# Patient Record
Sex: Female | Born: 1960 | Race: Black or African American | Hispanic: No | Smoking: Former smoker
Health system: Southern US, Community
[De-identification: ages and names within clinical notes are randomized; demographics above are authoritative.]

## PROBLEM LIST (undated history)

## (undated) ENCOUNTER — Inpatient Hospital Stay (HOSPITAL_COMMUNITY)

## (undated) DIAGNOSIS — I10 Essential (primary) hypertension: Secondary | ICD-10-CM

## (undated) DIAGNOSIS — N950 Postmenopausal bleeding: Secondary | ICD-10-CM

## (undated) DIAGNOSIS — N183 Chronic kidney disease, stage 3 unspecified: Secondary | ICD-10-CM

## (undated) DIAGNOSIS — Z973 Presence of spectacles and contact lenses: Secondary | ICD-10-CM

## (undated) DIAGNOSIS — E119 Type 2 diabetes mellitus without complications: Secondary | ICD-10-CM

## (undated) DIAGNOSIS — F419 Anxiety disorder, unspecified: Secondary | ICD-10-CM

## (undated) DIAGNOSIS — E785 Hyperlipidemia, unspecified: Secondary | ICD-10-CM

## (undated) DIAGNOSIS — F32A Depression, unspecified: Secondary | ICD-10-CM

## (undated) DIAGNOSIS — F2089 Other schizophrenia: Secondary | ICD-10-CM

## (undated) DIAGNOSIS — K08109 Complete loss of teeth, unspecified cause, unspecified class: Secondary | ICD-10-CM

## (undated) DIAGNOSIS — N189 Chronic kidney disease, unspecified: Secondary | ICD-10-CM

## (undated) DIAGNOSIS — N8501 Benign endometrial hyperplasia: Secondary | ICD-10-CM

## (undated) DIAGNOSIS — E876 Hypokalemia: Secondary | ICD-10-CM

## (undated) DIAGNOSIS — K219 Gastro-esophageal reflux disease without esophagitis: Secondary | ICD-10-CM

## (undated) DIAGNOSIS — N2581 Secondary hyperparathyroidism of renal origin: Secondary | ICD-10-CM

## (undated) DIAGNOSIS — Z972 Presence of dental prosthetic device (complete) (partial): Secondary | ICD-10-CM

## (undated) HISTORY — DX: Chronic kidney disease, unspecified: N18.9

---

## 1992-09-06 HISTORY — PX: TUBAL LIGATION: SHX77

## 2000-02-03 ENCOUNTER — Emergency Department (HOSPITAL_COMMUNITY): Admission: EM | Admit: 2000-02-03 | Discharge: 2000-02-03 | Payer: Self-pay | Admitting: Emergency Medicine

## 2004-10-04 ENCOUNTER — Emergency Department: Payer: Self-pay | Admitting: Internal Medicine

## 2009-11-28 ENCOUNTER — Emergency Department (HOSPITAL_COMMUNITY): Admission: EM | Admit: 2009-11-28 | Discharge: 2009-11-28 | Payer: Self-pay | Admitting: Emergency Medicine

## 2009-11-28 ENCOUNTER — Encounter: Payer: Self-pay | Admitting: Internal Medicine

## 2009-11-28 DIAGNOSIS — R55 Syncope and collapse: Secondary | ICD-10-CM | POA: Insufficient documentation

## 2009-11-28 LAB — CONVERTED CEMR LAB
BUN: 15 mg/dL
Basophils Relative: 0.1 %
CO2: 27 meq/L
Chloride: 101 meq/L
Creatinine, Ser: 1.3 mg/dL
Eosinophils Relative: 1 %
Glucose, Bld: 272 mg/dL
HCT: 43 %
Hemoglobin: 14.6 g/dL
Lymphocytes, automated: 14 %
MCV: 81.7 fL
Monocytes Relative: 2 %
Neutrophils Relative %: 83 %
Platelets: 291 10*3/uL
Potassium: 3.3 meq/L
RBC: 4.77 M/uL
RDW: 14.4 %
Sodium: 138 meq/L
WBC: 13.3 10*3/uL

## 2009-12-03 ENCOUNTER — Encounter: Payer: Self-pay | Admitting: Internal Medicine

## 2009-12-03 DIAGNOSIS — I10 Essential (primary) hypertension: Secondary | ICD-10-CM | POA: Insufficient documentation

## 2009-12-03 DIAGNOSIS — Z8639 Personal history of other endocrine, nutritional and metabolic disease: Secondary | ICD-10-CM

## 2009-12-03 DIAGNOSIS — F411 Generalized anxiety disorder: Secondary | ICD-10-CM | POA: Insufficient documentation

## 2009-12-03 DIAGNOSIS — Z862 Personal history of diseases of the blood and blood-forming organs and certain disorders involving the immune mechanism: Secondary | ICD-10-CM | POA: Insufficient documentation

## 2009-12-04 ENCOUNTER — Ambulatory Visit: Payer: Self-pay | Admitting: Internal Medicine

## 2010-11-27 LAB — URINALYSIS, ROUTINE W REFLEX MICROSCOPIC
Glucose, UA: 100 mg/dL — AB
Hgb urine dipstick: NEGATIVE
Ketones, ur: 15 mg/dL — AB
Leukocytes, UA: NEGATIVE
Nitrite: NEGATIVE
Protein, ur: 100 mg/dL — AB
Specific Gravity, Urine: 1.024 (ref 1.005–1.030)
Urobilinogen, UA: 1 mg/dL (ref 0.0–1.0)
pH: 5.5 (ref 5.0–8.0)

## 2010-11-27 LAB — POCT CARDIAC MARKERS
CKMB, poc: <1 ng/mL — ABNORMAL LOW (ref 1.0–8.0)
Myoglobin, poc: 141 ng/mL (ref 12–200)
Troponin i, poc: <0.05 ng/mL (ref 0.00–0.09)

## 2010-11-27 LAB — RAPID URINE DRUG SCREEN, HOSP PERFORMED
Amphetamines: NOT DETECTED
Barbiturates: NOT DETECTED
Benzodiazepines: NOT DETECTED
Cocaine: NOT DETECTED
Opiates: NOT DETECTED
Tetrahydrocannabinol: NOT DETECTED

## 2010-11-27 LAB — POCT I-STAT, CHEM 8
BUN: 15 mg/dL (ref 6–23)
Calcium, Ion: 1.12 mmol/L (ref 1.12–1.32)
Chloride: 101 mEq/L (ref 96–112)
Creatinine, Ser: 1.3 mg/dL — ABNORMAL HIGH (ref 0.4–1.2)
Glucose, Bld: 272 mg/dL — ABNORMAL HIGH (ref 70–99)
HCT: 43 % (ref 36.0–46.0)
Hemoglobin: 14.6 g/dL (ref 12.0–15.0)
Potassium: 3.3 mEq/L — ABNORMAL LOW (ref 3.5–5.1)
Sodium: 138 mEq/L (ref 135–145)
TCO2: 27 mmol/L (ref 0–100)

## 2010-11-27 LAB — CBC
HCT: 39 % (ref 36.0–46.0)
Hemoglobin: 12.7 g/dL (ref 12.0–15.0)
MCHC: 32.6 g/dL (ref 30.0–36.0)
MCV: 81.7 fL (ref 78.0–100.0)
Platelets: 291 10*3/uL (ref 150–400)
RBC: 4.77 MIL/uL (ref 3.87–5.11)
RDW: 14.4 % (ref 11.5–15.5)
WBC: 13.3 10*3/uL — ABNORMAL HIGH (ref 4.0–10.5)

## 2010-11-27 LAB — DIFFERENTIAL
Basophils Absolute: 0 10*3/uL (ref 0.0–0.1)
Basophils Relative: 0 % (ref 0–1)
Eosinophils Absolute: 0.1 10*3/uL (ref 0.0–0.7)
Eosinophils Relative: 1 % (ref 0–5)
Lymphocytes Relative: 14 % (ref 12–46)
Lymphs Abs: 1.9 10*3/uL (ref 0.7–4.0)
Monocytes Absolute: 0.3 10*3/uL (ref 0.1–1.0)
Monocytes Relative: 2 % — ABNORMAL LOW (ref 3–12)
Neutro Abs: 11 10*3/uL — ABNORMAL HIGH (ref 1.7–7.7)
Neutrophils Relative %: 83 % — ABNORMAL HIGH (ref 43–77)

## 2010-11-27 LAB — URINE MICROSCOPIC-ADD ON

## 2010-11-27 LAB — URINE CULTURE: Colony Count: 45000

## 2010-11-27 LAB — ETHANOL: Alcohol, Ethyl (B): <5 mg/dL (ref 0–10)

## 2011-05-26 ENCOUNTER — Other Ambulatory Visit: Payer: Self-pay | Admitting: Internal Medicine

## 2011-05-26 DIAGNOSIS — Z1231 Encounter for screening mammogram for malignant neoplasm of breast: Secondary | ICD-10-CM

## 2012-03-16 ENCOUNTER — Ambulatory Visit: Payer: Self-pay

## 2012-04-13 ENCOUNTER — Ambulatory Visit
Admission: RE | Admit: 2012-04-13 | Discharge: 2012-04-13 | Disposition: A | Discharge status: Home / Self Care | Payer: Medicaid Other | Source: Ambulatory Visit | Attending: Internal Medicine | Admitting: Internal Medicine

## 2012-04-13 DIAGNOSIS — Z1231 Encounter for screening mammogram for malignant neoplasm of breast: Secondary | ICD-10-CM

## 2014-06-07 ENCOUNTER — Other Ambulatory Visit: Payer: Self-pay

## 2014-06-07 DIAGNOSIS — Z1239 Encounter for other screening for malignant neoplasm of breast: Secondary | ICD-10-CM

## 2014-06-25 ENCOUNTER — Ambulatory Visit: Payer: Medicaid Other

## 2014-07-25 ENCOUNTER — Other Ambulatory Visit: Payer: Self-pay

## 2014-07-25 DIAGNOSIS — Z1231 Encounter for screening mammogram for malignant neoplasm of breast: Secondary | ICD-10-CM

## 2014-07-26 ENCOUNTER — Ambulatory Visit
Admission: RE | Admit: 2014-07-26 | Discharge: 2014-07-26 | Disposition: A | Discharge status: Home / Self Care | Payer: Medicaid Other | Source: Ambulatory Visit

## 2014-07-26 DIAGNOSIS — Z1231 Encounter for screening mammogram for malignant neoplasm of breast: Secondary | ICD-10-CM

## 2015-07-11 ENCOUNTER — Other Ambulatory Visit: Payer: Self-pay

## 2015-07-11 DIAGNOSIS — Z1231 Encounter for screening mammogram for malignant neoplasm of breast: Secondary | ICD-10-CM

## 2015-07-29 ENCOUNTER — Ambulatory Visit
Admission: RE | Admit: 2015-07-29 | Discharge: 2015-07-29 | Disposition: A | Discharge status: Home / Self Care | Payer: Medicaid Other | Source: Ambulatory Visit

## 2015-07-29 DIAGNOSIS — Z1231 Encounter for screening mammogram for malignant neoplasm of breast: Secondary | ICD-10-CM

## 2015-12-10 ENCOUNTER — Other Ambulatory Visit: Payer: Self-pay

## 2015-12-10 DIAGNOSIS — Z1231 Encounter for screening mammogram for malignant neoplasm of breast: Secondary | ICD-10-CM

## 2016-06-16 ENCOUNTER — Other Ambulatory Visit: Payer: Self-pay | Admitting: Internal Medicine

## 2016-06-16 DIAGNOSIS — E2839 Other primary ovarian failure: Secondary | ICD-10-CM

## 2016-08-05 ENCOUNTER — Ambulatory Visit
Admission: RE | Admit: 2016-08-05 | Discharge: 2016-08-05 | Disposition: A | Discharge status: Home / Self Care | Payer: Medicaid Other | Source: Ambulatory Visit

## 2016-08-05 DIAGNOSIS — Z1231 Encounter for screening mammogram for malignant neoplasm of breast: Secondary | ICD-10-CM

## 2016-08-09 ENCOUNTER — Other Ambulatory Visit: Payer: Self-pay | Admitting: Internal Medicine

## 2016-08-09 DIAGNOSIS — R928 Other abnormal and inconclusive findings on diagnostic imaging of breast: Secondary | ICD-10-CM

## 2016-08-12 ENCOUNTER — Ambulatory Visit
Admission: RE | Admit: 2016-08-12 | Discharge: 2016-08-12 | Disposition: A | Discharge status: Home / Self Care | Payer: Medicaid Other | Source: Ambulatory Visit | Attending: Internal Medicine | Admitting: Internal Medicine

## 2016-08-12 DIAGNOSIS — R928 Other abnormal and inconclusive findings on diagnostic imaging of breast: Secondary | ICD-10-CM

## 2017-07-08 ENCOUNTER — Other Ambulatory Visit: Payer: Self-pay | Admitting: Internal Medicine

## 2017-07-08 DIAGNOSIS — Z1231 Encounter for screening mammogram for malignant neoplasm of breast: Secondary | ICD-10-CM

## 2017-08-15 ENCOUNTER — Ambulatory Visit: Payer: Self-pay

## 2017-09-14 ENCOUNTER — Ambulatory Visit: Payer: Self-pay

## 2017-09-20 ENCOUNTER — Ambulatory Visit
Admission: RE | Admit: 2017-09-20 | Discharge: 2017-09-20 | Disposition: A | Discharge status: Home / Self Care | Payer: Medicaid Other | Source: Ambulatory Visit | Attending: Internal Medicine | Admitting: Internal Medicine

## 2017-09-20 DIAGNOSIS — Z1231 Encounter for screening mammogram for malignant neoplasm of breast: Secondary | ICD-10-CM

## 2017-09-23 ENCOUNTER — Encounter (HOSPITAL_COMMUNITY): Payer: Self-pay

## 2017-09-23 ENCOUNTER — Emergency Department (HOSPITAL_COMMUNITY): Payer: Medicare Other

## 2017-09-23 ENCOUNTER — Other Ambulatory Visit: Payer: Self-pay

## 2017-09-23 DIAGNOSIS — Z79899 Other long term (current) drug therapy: Secondary | ICD-10-CM | POA: Insufficient documentation

## 2017-09-23 DIAGNOSIS — R112 Nausea with vomiting, unspecified: Secondary | ICD-10-CM | POA: Diagnosis not present

## 2017-09-23 DIAGNOSIS — R0789 Other chest pain: Secondary | ICD-10-CM | POA: Diagnosis not present

## 2017-09-23 DIAGNOSIS — R079 Chest pain, unspecified: Secondary | ICD-10-CM | POA: Diagnosis present

## 2017-09-23 LAB — CBC
HCT: 35.6 % — ABNORMAL LOW (ref 36.0–46.0)
Hemoglobin: 11.6 g/dL — ABNORMAL LOW (ref 12.0–15.0)
MCH: 26.5 pg (ref 26.0–34.0)
MCHC: 32.6 g/dL (ref 30.0–36.0)
MCV: 81.3 fL (ref 78.0–100.0)
Platelets: 275 10*3/uL (ref 150–400)
RBC: 4.38 MIL/uL (ref 3.87–5.11)
RDW: 14.2 % (ref 11.5–15.5)
WBC: 12.4 10*3/uL — ABNORMAL HIGH (ref 4.0–10.5)

## 2017-09-23 LAB — URINALYSIS, ROUTINE W REFLEX MICROSCOPIC
Bilirubin Urine: NEGATIVE
Glucose, UA: NEGATIVE mg/dL
Hgb urine dipstick: NEGATIVE
Ketones, ur: NEGATIVE mg/dL
Leukocytes, UA: NEGATIVE
Nitrite: NEGATIVE
Protein, ur: NEGATIVE mg/dL
Specific Gravity, Urine: 1.01 (ref 1.005–1.030)
pH: 7 (ref 5.0–8.0)

## 2017-09-23 LAB — BASIC METABOLIC PANEL
Anion gap: 15 (ref 5–15)
BUN: 18 mg/dL (ref 6–20)
CO2: 25 mmol/L (ref 22–32)
Calcium: 9.4 mg/dL (ref 8.9–10.3)
Chloride: 92 mmol/L — ABNORMAL LOW (ref 101–111)
Creatinine, Ser: 1.49 mg/dL — ABNORMAL HIGH (ref 0.44–1.00)
GFR calc Af Amer: 44 mL/min — ABNORMAL LOW (ref >60)
GFR calc non Af Amer: 38 mL/min — ABNORMAL LOW (ref >60)
Glucose, Bld: 150 mg/dL — ABNORMAL HIGH (ref 65–99)
Potassium: 3.1 mmol/L — ABNORMAL LOW (ref 3.5–5.1)
Sodium: 132 mmol/L — ABNORMAL LOW (ref 135–145)

## 2017-09-23 LAB — I-STAT TROPONIN, ED: Troponin i, poc: 0.01 ng/mL (ref 0.00–0.08)

## 2017-09-23 LAB — LIPASE, BLOOD: Lipase: 132 U/L — ABNORMAL HIGH (ref 11–51)

## 2017-09-23 LAB — I-STAT BETA HCG BLOOD, ED (MC, WL, AP ONLY): I-stat hCG, quantitative: <5 m[IU]/mL (ref <5)

## 2017-09-23 MED ORDER — ONDANSETRON 4 MG PO TBDP
4.0000 mg | ORAL_TABLET | Freq: Once | ORAL | Status: AC | PRN
  Administered 2017-09-23: 4 mg via ORAL
  Filled 2017-09-23: qty 1

## 2017-09-23 NOTE — ED Notes (Signed)
Per family pt reports pain in chest has now moved to L side of chest and she had another episode of vomiting in WR.  Will order repeat EKG

## 2017-09-23 NOTE — ED Triage Notes (Signed)
Per family pt last ate last night; pt started having tightness in chest at 8 am this morning; pt c/o n/v early today and vomiting upon arrival to ED; pt is a&ox 4 on arrival.  Pt c/o of 7/10 heaviness in chest; pt has hx of DM,HTN, and hyperlipidemia;-Monique,RN

## 2017-09-24 ENCOUNTER — Emergency Department (HOSPITAL_COMMUNITY): Payer: Medicare Other

## 2017-09-24 ENCOUNTER — Emergency Department (HOSPITAL_COMMUNITY)
Admission: EM | Admit: 2017-09-24 | Discharge: 2017-09-24 | Disposition: A | Discharge status: Home / Self Care | Payer: Medicare Other | Attending: Emergency Medicine | Admitting: Emergency Medicine

## 2017-09-24 DIAGNOSIS — K8020 Calculus of gallbladder without cholecystitis without obstruction: Secondary | ICD-10-CM

## 2017-09-24 DIAGNOSIS — R0789 Other chest pain: Secondary | ICD-10-CM | POA: Diagnosis not present

## 2017-09-24 DIAGNOSIS — R112 Nausea with vomiting, unspecified: Secondary | ICD-10-CM

## 2017-09-24 HISTORY — DX: Type 2 diabetes mellitus without complications: E11.9

## 2017-09-24 HISTORY — DX: Essential (primary) hypertension: I10

## 2017-09-24 HISTORY — DX: Hyperlipidemia, unspecified: E78.5

## 2017-09-24 LAB — HEPATIC FUNCTION PANEL
ALT: 17 U/L (ref 14–54)
AST: 20 U/L (ref 15–41)
Albumin: 3.8 g/dL (ref 3.5–5.0)
Alkaline Phosphatase: 74 U/L (ref 38–126)
Bilirubin, Direct: 0.2 mg/dL (ref 0.1–0.5)
Indirect Bilirubin: 0.7 mg/dL (ref 0.3–0.9)
Total Bilirubin: 0.9 mg/dL (ref 0.3–1.2)
Total Protein: 7.4 g/dL (ref 6.5–8.1)

## 2017-09-24 LAB — I-STAT TROPONIN, ED: Troponin i, poc: 0 ng/mL (ref 0.00–0.08)

## 2017-09-24 MED ORDER — METOCLOPRAMIDE HCL 10 MG PO TABS
10.0000 mg | ORAL_TABLET | Freq: Once | ORAL | Status: AC
  Administered 2017-09-24: 10 mg via ORAL
  Filled 2017-09-24: qty 1

## 2017-09-24 MED ORDER — PANTOPRAZOLE SODIUM 40 MG PO TBEC: 40.0000 mg | DELAYED_RELEASE_TABLET | Freq: Every day | ORAL | 0 refills | Status: DC

## 2017-09-24 MED ORDER — GI COCKTAIL ~~LOC~~
30.0000 mL | Freq: Once | ORAL | Status: AC
  Administered 2017-09-24: 30 mL via ORAL
  Filled 2017-09-24: qty 30

## 2017-09-24 NOTE — ED Notes (Signed)
Attempted blood draw unsuccessfully  

## 2017-09-24 NOTE — ED Provider Notes (Signed)
Patient is currently asymptomatic.  She states the GI cocktail relieved her chest pain.  No further vomiting.  Her ultrasound shows a large gallstone but this does not appear to be symptomatic.  Her LFTs are benign.  Second troponin normal.  I discussed her ultrasound results with on-call general surgery, Dr. Derrell Lollingamirez, who reviewed the images and recommends discharge.  Given that she has no abdominal pain he thinks the odds of this being cholecystitis is quite low.  However I will discuss strict return precautions with patient she understands to return if any new or concerning symptoms such as abdominal pain, vomiting, or fever occur.   Pricilla LovelessGoldston, Almetta Liddicoat, MD 09/24/17 0930

## 2017-09-24 NOTE — Discharge Instructions (Signed)
Your gallbladder showed a large stone and some possible early thickening of the gallbladder wall.  If you develop abdominal pain, vomiting, fever, or any new or concerning symptoms, return to the ER immediately.  Otherwise follow-up with a general surgeon as an outpatient to discuss taking out her gallbladder.  Follow-up with your primary care doctor for further workup of your chest pain.

## 2017-09-24 NOTE — ED Notes (Signed)
Attempt blood draw, IV start, unsuccessful.

## 2017-09-24 NOTE — ED Provider Notes (Signed)
[facility] EMERGENCY DEPARTMENT Provider Note  CSN: [id number] Arrival date & time: 09/23/17 1853  Chief Complaint(s) Chest Pain; Emesis; and Fatigue  HPI Laura Fitzgerald is a 57 y.o. female   The history is provided by the patient.  Chest Pain   This is a new problem. The current episode started 12 to 24 hours ago. The problem occurs constantly. The problem has been gradually improving. The pain is present in the substernal region. The quality of the pain is described as heavy. The pain does not radiate. Exacerbated by: nothing. Associated symptoms include nausea and vomiting (NBNB). Pertinent negatives include no abdominal pain, no back pain, no cough, no diaphoresis, no fever, no headaches, no hemoptysis and no shortness of breath. She has tried nothing for the symptoms. Risk factors include obesity and being elderly.  Her past medical history is significant for diabetes, hyperlipidemia and hypertension.  Pertinent negatives for past medical history include no CAD, no cancer, no DVT, no MI, no PE and no strokes.  Pertinent negatives for family medical history include: no early MI.  Procedure history is negative for cardiac catheterization, stress echo, stress thallium and exercise treadmill test.  Emesis   Pertinent negatives include no abdominal pain, no cough, no fever and no headaches.   Reports that the chest pain improves with emesis.   Past Medical History Past Medical History:  Diagnosis Date  . Diabetes mellitus without complication (HCC)   . Hyperlipemia   . Hypertension    Patient Active Problem List   Diagnosis Date Noted  . ANXIETY 12/03/2009  . HYPERTENSION 12/03/2009  . HYPOKALEMIA, HX OF 12/03/2009  . SYNCOPE 11/28/2009   Home Medication(s) Prior to Admission medications   Medication Sig Start Date End Date Taking? Authorizing Provider  aspirin EC 81 MG tablet Take 81 mg by mouth daily.   Yes [provider]  diphenhydrAMINE  (BENADRYL) 25 mg capsule Take 50 mg by mouth at bedtime.   Yes [provider]  glimepiride (AMARYL) 4 MG tablet Take 4 mg by mouth daily. 09/09/17  Yes [provider]  Haloperidol Lactate (HALDOL IJ) Inject as directed every 30 (thirty) days.   Yes [provider]  lisinopril-hydrochlorothiazide (PRINZIDE,ZESTORETIC) 20-12.5 MG tablet Take 1 tablet by mouth daily. 09/09/17  Yes [provider]  naproxen sodium (ALEVE) 220 MG tablet Take 220 mg by mouth 2 (two) times daily as needed (pain).   Yes [provider]  simvastatin (ZOCOR) 20 MG tablet Take 20 mg by mouth daily. 09/09/17  Yes [provider]                                                                                                                                    Past Surgical History  The histories are not reviewed yet. Please review them in the "History" navigator section and refresh this SmartLink. Family History No family history on file.  Social History Social History   Tobacco Use  . Smoking status: Never Smoker  . Smokeless tobacco: Never Used  Substance Use Topics  . Alcohol use: No    Frequency: Never  . Drug use: No   Allergies Patient has no known allergies.  Review of Systems Review of Systems  Constitutional: Negative for diaphoresis and fever.  Respiratory: Negative for cough, hemoptysis and shortness of breath.   Cardiovascular: Positive for chest pain.  Gastrointestinal: Positive for nausea and vomiting (NBNB). Negative for abdominal pain.  Musculoskeletal: Negative for back pain.  Neurological: Negative for headaches.   All other systems are reviewed and are negative for acute change except as noted in the HPI  Physical Exam Vital Signs  I have reviewed the triage vital signs BP (!) 145/67 (BP Location: Right Arm)   Pulse 94   Temp 98.7 F (37.1 C) (Oral)   Resp (!) 24   SpO2 96%   Physical Exam  Constitutional: She is oriented to  person, place, and time. She appears well-developed and well-nourished. No distress.  Obese  HENT:  Head: Normocephalic and atraumatic.  Nose: Nose normal.  Eyes: Conjunctivae and EOM are normal. Pupils are equal, round, and reactive to light. Right eye exhibits no discharge. Left eye exhibits no discharge. No scleral icterus.  Neck: Normal range of motion. Neck supple.  Cardiovascular: Normal rate and regular rhythm. Exam reveals no gallop and no friction rub.  No murmur heard. Pulmonary/Chest: Effort normal and breath sounds normal. No stridor. No respiratory distress. She has no rales.  Abdominal: Soft. She exhibits no distension. There is no tenderness. There is no rigidity, no rebound, no guarding and negative Murphy's sign. No hernia.  Musculoskeletal: She exhibits no edema or tenderness.  Neurological: She is alert and oriented to person, place, and time.  Skin: Skin is warm and dry. No rash noted. She is not diaphoretic. No erythema.  Psychiatric: She has a normal mood and affect.  Vitals reviewed.   ED Results and Treatments Labs (all labs ordered are listed, but only abnormal results are displayed) Labs Reviewed  BASIC METABOLIC PANEL - Abnormal; Notable for the following components:      Result Value   Sodium 132 (*)    Potassium 3.1 (*)    Chloride 92 (*)    Glucose, Bld 150 (*)    Creatinine, Ser 1.49 (*)    GFR calc non Af Amer 38 (*)    GFR calc Af Amer 44 (*)    All other components within normal limits  CBC - Abnormal; Notable for the following components:   WBC 12.4 (*)    Hemoglobin 11.6 (*)    HCT 35.6 (*)    All other components within normal limits  LIPASE, BLOOD - Abnormal; Notable for the following components:   Lipase 132 (*)    All other components within normal limits  URINALYSIS, ROUTINE W REFLEX MICROSCOPIC  HEPATIC FUNCTION PANEL  I-STAT TROPONIN, ED  I-STAT BETA HCG BLOOD, ED ([facility], [facility], [facility] ONLY)  I-STAT TROPONIN, ED  EKG  EKG Interpretation  Date/Time:  Friday September 23 2017 20:47:58 EST Ventricular Rate:  92 PR Interval:  208 QRS Duration: 78 QT Interval:  382 QTC Calculation: 472 R Axis:   17 Text Interpretation:  Normal sinus rhythm Cannot rule out Anterior infarct , age undetermined Abnormal ECG No significant change since last tracing Confirmed by Drema Pry [phone]) on 09/24/2017 5:22:28 AM      Radiology Dg Chest 2 View  Result Date: 09/23/2017 CLINICAL DATA:  Acute chest pain today. EXAM: CHEST  2 VIEW COMPARISON:  None. FINDINGS: This is a mildly low volume film. The cardiomediastinal silhouette is unremarkable. There is no evidence of focal airspace disease, pulmonary edema, suspicious pulmonary nodule/mass, pleural effusion, or pneumothorax. No acute bony abnormalities are identified. IMPRESSION: No active cardiopulmonary disease. Electronically Signed   By: Harmon Pier M.D.   On: 09/23/2017 19:58   US Abdomen Limited Ruq  Result Date: 09/24/2017 CLINICAL DATA:  Nausea and vomiting EXAM: ULTRASOUND ABDOMEN LIMITED RIGHT UPPER QUADRANT COMPARISON:  None. FINDINGS: Gallbladder: Within the gallbladder, there are echogenic foci which move and shadow consistent with cholelithiasis. Largest gallstone measures 2.0 cm in length. There is localized gallbladder wall thickening in the neck of the gallbladder with nearby pericholecystic fluid. No sonographic Murphy sign noted by sonographer. Common bile duct: Diameter: 2 mm. No intrahepatic or extrahepatic biliary duct dilatation. Liver: No focal lesion identified. Within normal limits in parenchymal echogenicity. Portal vein is patent on color Doppler imaging with normal direction of blood flow towards the liver. IMPRESSION: Cholelithiasis with localized gallbladder wall thickening in the neck region with nearby pericholecystic fluid. These are findings concerning  for acute cholecystitis. Study otherwise unremarkable. Electronically Signed   By: Bretta Bang III M.D.   On: 09/24/2017 07:36   Pertinent labs & imaging results that were available during my care of the patient were reviewed by me and considered in my medical decision making (see chart for details).  Medications Ordered in ED Medications  ondansetron (ZOFRAN-ODT) disintegrating tablet 4 mg (4 mg Oral Given 09/23/17 1937)  metoCLOPramide (REGLAN) tablet 10 mg (10 mg Oral Given 09/24/17 0656)  gi cocktail (Maalox,Lidocaine,Donnatal) (30 mLs Oral Given 09/24/17 2130)                                                                                                                                    Procedures Procedures  (including critical care time)  Medical Decision Making / ED Course I have reviewed the nursing notes for this encounter and the patient's prior records (if available in EHR or on provided paperwork).    Presentation is highly atypical for ACS however patient does have significant cardiac risk factors.  EKG without acute ischemic changes or evidence of pericarditis.  Troponin drawn at triage that was approximately 16 hours after onset of pain was negative.  Will obtain a second troponin that will be beyond the 24-hour mark.  If this is negative patient is sufficiently ruled out for ACS.  Low pretest probability for pulmonary embolism.  Presentation not classic for aortic dissection.  Chest x-ray without evidence suggestive of pneumonia, pneumothorax, pneumomediastinum.  No abnormal contour of the mediastinum to suggest dissection. No evidence of acute injuries.  Doubt esophageal perforation.  Abdomen is benign.  Likely GI related; gastritis versus esophagitis.  However screening labs from triage revealed no evidence of leukocytosis with elevated lipase.  LFTs were not initially ordered; will do so now to assess for any biliary obstruction.  We will also obtain a right  upper quadrant ultrasound.  Patient care turned over to Dr Rayford HalstedGolston at 0730. Patient case and results discussed in detail; please see their note for further ED managment.      This chart was dictated using voice recognition software.  Despite best efforts to proofread,  errors can occur which can change the documentation meaning.   Nira Connardama, Altus Zaino Eduardo, MD 09/24/17 [phone]

## 2017-09-24 NOTE — ED Notes (Signed)
Patient transported to Ultrasound 

## 2017-09-28 ENCOUNTER — Ambulatory Visit: Payer: Self-pay | Admitting: Surgery

## 2017-09-28 DIAGNOSIS — E7800 Pure hypercholesterolemia, unspecified: Secondary | ICD-10-CM

## 2017-09-28 DIAGNOSIS — Z800 Family history of malignant neoplasm of digestive organs: Secondary | ICD-10-CM

## 2017-09-28 DIAGNOSIS — E119 Type 2 diabetes mellitus without complications: Secondary | ICD-10-CM | POA: Insufficient documentation

## 2017-09-28 NOTE — H&P (Signed)
Corky Mull Documented: 09/28/2017 9:15 AM Location: [facility] Patient #: [id number] DOB: November 22, 1960 Divorced / Language: [place] / Race: Black or African American Female  History of Present Illness Ardeth Sportsman MD; 09/28/2017 9:46 AM) The patient is a 57 year old female who presents with non-malignant abdominal pain. Note for "Non-malignant abdominal pain": ` ` ` Patient sent for surgical consultation at the request of Dr. Pricilla Loveless, [facility]  Chief Complaint: Chest and upper abdominal pain with nausea and vomiting gallstones.  The patient is a pleasant but anxious woman who had sudden severe epigastric and substernal chest pain. Nausea and vomiting. Concerned and persisted. Came to emergency room. Mildly elevated white count. Cardiac workup negative for myocardial infarction. Some possible improvement in GI cocktail. However discomfort was concerning for possible gallbladder tach. Ultrasound showed large gallstones with some pericholecystic fluid suspicious for acute cholecystitis. After treatment emergency room she will was asymptomatic 4 hours later. Discussed with the surgeon on call. Recommendation for outpatient follow-up.  Patient notes that she cannot tolerate greasy food very well. She's been trying to stay away from and since that bad attack last week. She notes even water occasionally bothers her. Think she's had mild heartburn issues the past. Tums usually takes care of it. She was sent home on some Protonix and wonders if she can continue it. She moves her bowels every day. Her mother was diagnosed with colon cancer in her late 73s. The patient's never had a colonoscopy.   No personal nor family history of GI/colon cancer, inflammatory bowel disease, irritable bowel syndrome, allergy such as Celiac Sprue, dietary/dairy problems, colitis, ulcers nor gastritis. No recent sick contacts/gastroenteritis. No travel outside the country. No  changes in diet. No dysphagia to solids or liquids. No hematochezia, hematemesis, coffee ground emesis. No evidence of prior gastric/peptic ulceration. She does go to [facility] downtown and gets occasional Haldol shots for uncertain etiology. She claims that his depression and anxiety.    (Review of systems as stated in this history (HPI) or in the review of systems. Otherwise all other 12 point ROS are negative)   Problem List/Past Medical Ardeth Sportsman, MD; 09/28/2017 9:33 AM) Diabetes Anxiety High Cholesterol  Past Surgical History Ardeth Sportsman, MD; 09/28/2017 9:31 AM) Gallbladder Surgery - Open TUBAL LIGATION, ESSURE [phone]) TUBAL LIGATION, LAPAROSCOPIC [phone])  Diagnostic Studies History Ethlyn Gallery, CMA; 09/28/2017 9:16 AM) Colonoscopy 1-5 years ago Mammogram within last year Pap Smear 1-5 years ago  Allergies Ethlyn Gallery, CMA; 09/28/2017 9:16 AM) No Known Drug Allergies [09/28/2017]:  Medication History (Alisha Spillers, CMA; 09/28/2017 9:17 AM) Aspirin (81MG  Tablet DR, Oral) Active. Glimepiride (4MG  Tablet, Oral) Active. Lisinopril-Hydrochlorothiazide (20-12.5MG  Tablet, Oral) Active. Pantoprazole Sodium (40MG  Tablet DR, Oral) Active. Simvastatin (20MG  Tablet, Oral) Active. Medications Reconciled  Family History Ethlyn Gallery, CMA; 09/28/2017 9:16 AM) Colon Cancer Father. Diabetes Mellitus Father. Heart Disease Father. Hypertension Father.  Pregnancy / Birth History Ethlyn Gallery, CMA; 09/28/2017 9:16 AM) Age at menarche 11 years. Gravida 3 Maternal age 58-20 Para 3  Other Problems Ardeth Sportsman, MD; 09/28/2017 9:33 AM) Depression Diabetes Mellitus Gastroesophageal Reflux Disease High blood pressure Hypercholesterolemia     Review of Systems (Alisha Spillers CMA; 09/28/2017 9:16 AM) General Not Present- Appetite Loss, Chills, Fatigue, Fever, Night Sweats, Weight Gain and Weight Loss. Skin  Not Present- Change in Wart/Mole, Dryness, Hives, Jaundice, New Lesions, Non-Healing Wounds, Rash and Ulcer. HEENT Present- Wears glasses/contact lenses. Not Present- Earache, Hearing Loss, Hoarseness, Nose Bleed, Oral  Ulcers, Ringing in the Ears, Seasonal Allergies, Sinus Pain, Sore Throat, Visual Disturbances and Yellow Eyes. Breast Not Present- Breast Mass, Breast Pain, Nipple Discharge and Skin Changes. Cardiovascular Present- Chest Pain. Not Present- Difficulty Breathing Lying Down, Leg Cramps, Palpitations, Rapid Heart Rate, Shortness of Breath and Swelling of Extremities. Gastrointestinal Present- Indigestion and Nausea. Not Present- Abdominal Pain, Bloating, Bloody Stool, Change in Bowel Habits, Chronic diarrhea, Constipation, Difficulty Swallowing, Excessive gas, Gets full quickly at meals, Hemorrhoids, Rectal Pain and Vomiting. Female Genitourinary Not Present- Frequency, Nocturia, Painful Urination, Pelvic Pain and Urgency. Musculoskeletal Not Present- Back Pain, Joint Pain, Joint Stiffness, Muscle Pain, Muscle Weakness and Swelling of Extremities. Neurological Not Present- Decreased Memory, Fainting, Headaches, Numbness, Seizures, Tingling, Tremor, Trouble walking and Weakness. Psychiatric Present- Anxiety and Depression. Not Present- Bipolar, Change in Sleep Pattern, Fearful and Frequent crying. Endocrine Not Present- Cold Intolerance, Excessive Hunger, Hair Changes, Heat Intolerance, Hot flashes and New Diabetes. Hematology Present- Blood Thinners. Not Present- Easy Bruising, Excessive bleeding, Gland problems, HIV and Persistent Infections.  Vitals (Alisha Spillers CMA; 09/28/2017 9:16 AM) 09/28/2017 9:16 AM Weight: 268 lb Height: 71.5in Body Surface Area: 2.4 m Body Mass Index: 36.86 kg/m  Pulse: 104 (Regular)  BP: 128/82 (Sitting, Left Arm, Standard)      Physical Exam Ardeth Sportsman(Bishoy Cupp C. Kirra Verga MD; 09/28/2017 9:45 AM)  General Mental Status-Alert. General Appearance-Not  in acute distress, Not Sickly. Orientation-Oriented X3. Hydration-Well hydrated. Voice-Normal.  Integumentary Global Assessment Upon inspection and palpation of skin surfaces of the - Axillae: non-tender, no inflammation or ulceration, no drainage. and Distribution of scalp and body hair is normal. General Characteristics Temperature - normal warmth is noted.  Head and Neck Head-normocephalic, atraumatic with no lesions or palpable masses. Face Global Assessment - atraumatic, no absence of expression. Neck Global Assessment - no abnormal movements, no bruit auscultated on the right, no bruit auscultated on the left, no decreased range of motion, non-tender. Trachea-midline. Thyroid Gland Characteristics - non-tender.  Eye Eyeball - Left-Extraocular movements intact, No Nystagmus. Eyeball - Right-Extraocular movements intact, No Nystagmus. Cornea - Left-No Hazy. Cornea - Right-No Hazy. Sclera/Conjunctiva - Left-No scleral icterus, No Discharge. Sclera/Conjunctiva - Right-No scleral icterus, No Discharge. Pupil - Left-Direct reaction to light normal. Pupil - Right-Direct reaction to light normal. Note: Wears glasses. Vision corrected  ENMT Ears Pinna - Left - no drainage observed, no generalized tenderness observed. Right - no drainage observed, no generalized tenderness observed. Nose and Sinuses External Inspection of the Nose - no destructive lesion observed. Inspection of the nares - Left - quiet respiration. Right - quiet respiration. Mouth and Throat Lips - Upper Lip - no fissures observed, no pallor noted. Lower Lip - no fissures observed, no pallor noted. Nasopharynx - no discharge present. Oral Cavity/Oropharynx - Tongue - no dryness observed. Oral Mucosa - no cyanosis observed. Hypopharynx - no evidence of airway distress observed.  Chest and Lung Exam Inspection Movements - Normal and Symmetrical. Accessory muscles - No use of accessory  muscles in breathing. Palpation Palpation of the chest reveals - Non-tender. Auscultation Breath sounds - Normal and Clear.  Cardiovascular Auscultation Rhythm - Regular. Murmurs & Other Heart Sounds - Auscultation of the heart reveals - No Murmurs and No Systolic Clicks.  Abdomen Inspection Inspection of the abdomen reveals - No Visible peristalsis and No Abnormal pulsations. Umbilicus - No Bleeding, No Urine drainage. Palpation/Percussion Palpation and Percussion of the abdomen reveal - Soft, Non Tender, No Rebound tenderness, No Rigidity (guarding) and No Cutaneous hyperesthesia. Note: Abdomen morbidly  obese soft. Nontender. Not distended. No distasis recti. No umbilical or other anterior abdominal wall hernias  Female Genitourinary Sexual Maturity Tanner 5 - Adult hair pattern. Note: No vaginal bleeding nor discharge  Peripheral Vascular Upper Extremity Inspection - Left - No Cyanotic nailbeds, Not Ischemic. Right - No Cyanotic nailbeds, Not Ischemic.  Neurologic Neurologic evaluation reveals -normal attention span and ability to concentrate, able to name objects and repeat phrases. Appropriate fund of knowledge , normal sensation and normal coordination. Mental Status Affect - not angry, not paranoid. Cranial Nerves-Normal Bilaterally. Gait-Normal.  Neuropsychiatric Mental status exam performed with findings of-able to articulate well with normal speech/language, rate, volume and coherence, thought content normal with ability to perform basic computations and apply abstract reasoning and no evidence of hallucinations, delusions, obsessions or homicidal/suicidal ideation. Note: Talia anxious. Some fidgeting tremors especially the left hand. Not resting or intentional tremor.  Musculoskeletal Global Assessment Spine, Ribs and Pelvis - no instability, subluxation or laxity. Right Upper Extremity - no instability, subluxation or laxity.  Lymphatic Head &  Neck  General Head & Neck Lymphatics: Bilateral - Description - No Localized lymphadenopathy. Axillary  General Axillary Region: Bilateral - Description - No Localized lymphadenopathy. Femoral & Inguinal  Generalized Femoral & Inguinal Lymphatics: Left - Description - No Localized lymphadenopathy. Right - Description - No Localized lymphadenopathy.    Assessment & Plan Ardeth Sportsman MD; 09/28/2017 9:47 AM)  CHRONIC CHOLECYSTITIS WITH CALCULUS (K80.10) Impression: Postprandial severe epigastric and chest pain with heavy greasy meal with reproduction of smaller but similar symptoms with greasy food and now even to water. I suspect she is having gallbladder attacks and would benefit from cholecystectomy. Ultrasound showed gallstones with some inflammation suspicious for at least chronic cholecystitis.  I have offered cholecystectomy. Start out single site. May need to be four-port given her obesity but we will see. No strong indication a liver biopsy since liver function tests normal but she does have hypercholesterolemia on a statin and has some mental health issues.  She is urged in proceeding with surgery as soon as possible. She is hoping I could do it Friday, in 2 days. She is not toxic right now, so I do not think it's an emergency but I will try to get it done in a timely fashion.  Current Plans You are being scheduled for surgery- Our schedulers will call you.  You should hear from our office's scheduling department within 5 working days about the location, date, and time of surgery. We try to make accommodations for patient's preferences in scheduling surgery, but sometimes the OR schedule or the surgeon's schedule prevents [place] from making those accommodations.  If you have not heard from our office [phone]) in 5 working days, call the office and ask for your surgeon's nurse.  If you have other questions about your diagnosis, plan, or surgery, call the office and ask for  your surgeon's nurse.  Written instructions provided Pt Education - Pamphlet Given - Laparoscopic Gallbladder Surgery: discussed with patient and provided information. The anatomy & physiology of hepatobiliary & pancreatic function was discussed. The pathophysiology of gallbladder dysfunction was discussed. Natural history risks without surgery was discussed. I feel the risks of no intervention will lead to serious problems that outweigh the operative risks; therefore, I recommended cholecystectomy to remove the pathology. I explained laparoscopic techniques with possible need for an open approach. Probable cholangiogram to evaluate the bilary tract was explained as well.  Risks such as bleeding, infection, abscess, leak, injury to  other organs, need for further treatment, heart attack, death, and other risks were discussed. I noted a good likelihood this will help address the problem. Possibility that this will not correct all abdominal symptoms was explained. Goals of post-operative recovery were discussed as well. We will work to minimize complications. An educational handout further explaining the pathology and treatment options was given as well. Questions were answered. The patient expresses understanding & wishes to proceed with surgery.  Pt Education - CCS Laparosopic Post Op HCI (Cayla Wiegand) Pt Education - CCS Good Bowel Health (Queena Monrreal) Pt Education - Laparoscopic Cholecystectomy: gallbladder  FAMILY HISTORY OF COLON CANCER IN MOTHER (Z80.0) Impression: Given her age over 15 with a first-degree relative (mother) that had colon cancer, I strongly recommend she get a colonoscopy. She is overdue. She will consider it.  Current Plans Instructed to schedule colonoscopywith a gastroenterologist  HEARTBURN (R12) Impression: Some symptoms of heartburn used to control with Tums. Suspect related to her morbid obesity.  I will defer long-term management her primary care physician. I don't  see any strong reason for her to continue her Protonix since her symptoms seem to be much more consistent with biliary colic.

## 2017-10-20 ENCOUNTER — Other Ambulatory Visit: Payer: Self-pay | Admitting: Surgery

## 2017-10-20 HISTORY — PX: CHOLECYSTECTOMY, LAPAROSCOPIC: SHX56

## 2018-08-14 ENCOUNTER — Other Ambulatory Visit: Payer: Self-pay | Admitting: Internal Medicine

## 2018-08-14 DIAGNOSIS — Z1231 Encounter for screening mammogram for malignant neoplasm of breast: Secondary | ICD-10-CM

## 2018-08-16 ENCOUNTER — Other Ambulatory Visit: Payer: Self-pay | Admitting: Nephrology

## 2018-08-16 DIAGNOSIS — N183 Chronic kidney disease, stage 3 unspecified: Secondary | ICD-10-CM

## 2018-08-21 ENCOUNTER — Ambulatory Visit
Admission: RE | Admit: 2018-08-21 | Discharge: 2018-08-21 | Disposition: A | Discharge status: Home / Self Care | Payer: Medicare Other | Source: Ambulatory Visit | Attending: Nephrology | Admitting: Nephrology

## 2018-08-21 DIAGNOSIS — N183 Chronic kidney disease, stage 3 unspecified: Secondary | ICD-10-CM

## 2018-09-25 ENCOUNTER — Ambulatory Visit
Admission: RE | Admit: 2018-09-25 | Discharge: 2018-09-25 | Disposition: A | Discharge status: Home / Self Care | Payer: Medicare Other | Source: Ambulatory Visit | Attending: Internal Medicine | Admitting: Internal Medicine

## 2018-09-25 DIAGNOSIS — Z1231 Encounter for screening mammogram for malignant neoplasm of breast: Secondary | ICD-10-CM

## 2019-08-27 ENCOUNTER — Other Ambulatory Visit: Payer: Self-pay | Admitting: Internal Medicine

## 2019-08-27 DIAGNOSIS — Z1231 Encounter for screening mammogram for malignant neoplasm of breast: Secondary | ICD-10-CM

## 2019-09-07 HISTORY — PX: CHOLECYSTECTOMY: SHX55

## 2019-10-15 ENCOUNTER — Ambulatory Visit: Payer: Medicare Other

## 2019-11-23 ENCOUNTER — Ambulatory Visit
Admission: RE | Admit: 2019-11-23 | Discharge: 2019-11-23 | Disposition: A | Discharge status: Home / Self Care | Payer: Medicare Other | Source: Ambulatory Visit | Attending: Internal Medicine | Admitting: Internal Medicine

## 2019-11-23 ENCOUNTER — Other Ambulatory Visit: Payer: Self-pay

## 2019-11-23 DIAGNOSIS — Z1231 Encounter for screening mammogram for malignant neoplasm of breast: Secondary | ICD-10-CM

## 2019-11-27 ENCOUNTER — Other Ambulatory Visit: Payer: Self-pay | Admitting: Internal Medicine

## 2019-11-27 DIAGNOSIS — R928 Other abnormal and inconclusive findings on diagnostic imaging of breast: Secondary | ICD-10-CM

## 2019-12-05 ENCOUNTER — Ambulatory Visit
Admission: RE | Admit: 2019-12-05 | Discharge: 2019-12-05 | Disposition: A | Discharge status: Home / Self Care | Payer: Medicare Other | Source: Ambulatory Visit | Attending: Internal Medicine | Admitting: Internal Medicine

## 2019-12-05 ENCOUNTER — Other Ambulatory Visit: Payer: Self-pay

## 2019-12-05 DIAGNOSIS — R928 Other abnormal and inconclusive findings on diagnostic imaging of breast: Secondary | ICD-10-CM

## 2020-01-23 ENCOUNTER — Other Ambulatory Visit: Payer: Self-pay | Admitting: Internal Medicine

## 2020-01-23 DIAGNOSIS — E2839 Other primary ovarian failure: Secondary | ICD-10-CM

## 2020-04-10 ENCOUNTER — Other Ambulatory Visit: Payer: Medicare Other

## 2020-07-15 ENCOUNTER — Other Ambulatory Visit: Payer: Medicare Other

## 2020-08-04 ENCOUNTER — Other Ambulatory Visit: Payer: Self-pay | Admitting: Internal Medicine

## 2020-08-04 DIAGNOSIS — E2839 Other primary ovarian failure: Secondary | ICD-10-CM

## 2020-11-10 ENCOUNTER — Other Ambulatory Visit: Payer: Self-pay | Admitting: Internal Medicine

## 2020-11-10 DIAGNOSIS — Z1231 Encounter for screening mammogram for malignant neoplasm of breast: Secondary | ICD-10-CM

## 2020-11-11 ENCOUNTER — Other Ambulatory Visit: Payer: Medicare Other

## 2021-01-02 ENCOUNTER — Ambulatory Visit
Admission: RE | Admit: 2021-01-02 | Discharge: 2021-01-02 | Disposition: A | Discharge status: Home / Self Care | Payer: Medicare Other | Source: Ambulatory Visit | Attending: Internal Medicine | Admitting: Internal Medicine

## 2021-01-02 ENCOUNTER — Other Ambulatory Visit: Payer: Self-pay

## 2021-01-02 DIAGNOSIS — Z1231 Encounter for screening mammogram for malignant neoplasm of breast: Secondary | ICD-10-CM

## 2021-03-30 ENCOUNTER — Other Ambulatory Visit: Payer: Self-pay | Admitting: Internal Medicine

## 2021-03-31 LAB — RENAL PROFILE WITH ESTIMATED GFR
Albumin: 4.3 g/dL (ref 3.6–5.1)
BUN/Creatinine Ratio: 9 (calc) (ref 6–22)
BUN: 15 mg/dL (ref 7–25)
CO2: 27 mmol/L (ref 20–32)
Calcium: 9.7 mg/dL (ref 8.6–10.4)
Chloride: 100 mmol/L (ref 98–110)
Creat: 1.67 mg/dL — ABNORMAL HIGH (ref 0.50–1.05)
Glucose, Bld: 142 mg/dL — ABNORMAL HIGH (ref 65–99)
Phosphorus: 3.7 mg/dL (ref 2.5–4.5)
Potassium: 4.2 mmol/L (ref 3.5–5.3)
Sodium: 138 mmol/L (ref 135–146)
eGFR: 35 mL/min/{1.73_m2} — ABNORMAL LOW (ref >=60)

## 2021-03-31 LAB — EXTRA LAV TOP TUBE

## 2021-09-23 ENCOUNTER — Other Ambulatory Visit: Payer: Self-pay

## 2021-09-23 ENCOUNTER — Ambulatory Visit (INDEPENDENT_AMBULATORY_CARE_PROVIDER_SITE_OTHER): Payer: Medicare Other | Admitting: Obstetrics and Gynecology

## 2021-09-23 ENCOUNTER — Other Ambulatory Visit (HOSPITAL_COMMUNITY)
Admission: RE | Admit: 2021-09-23 | Discharge: 2021-09-23 | Disposition: A | Discharge status: Home / Self Care | Payer: Medicare Other | Source: Ambulatory Visit | Attending: Obstetrics and Gynecology | Admitting: Obstetrics and Gynecology

## 2021-09-23 ENCOUNTER — Encounter: Payer: Self-pay | Admitting: Obstetrics and Gynecology

## 2021-09-23 VITALS — BP 116/77 | HR 94 | Ht 69.5 in | Wt 258.6 lb

## 2021-09-23 DIAGNOSIS — Z01419 Encounter for gynecological examination (general) (routine) without abnormal findings: Secondary | ICD-10-CM | POA: Diagnosis present

## 2021-09-23 DIAGNOSIS — Z1151 Encounter for screening for human papillomavirus (HPV): Secondary | ICD-10-CM | POA: Insufficient documentation

## 2021-09-23 NOTE — Progress Notes (Signed)
NEW pt in the office today for annual exam. Pt unsure of last pap smear. Pt denies any vaginal discharge, irritation. Last mammogram 01/02/21

## 2021-09-23 NOTE — Progress Notes (Signed)
GYNECOLOGY ANNUAL PREVENTATIVE CARE ENCOUNTER NOTE  History:     Laura Fitzgerald is a 61 y.o. No obstetric history on file. female here for a routine annual gynecologic exam.    Current complaints: None.     Denies abnormal vaginal bleeding, discharge, pelvic pain, problems with intercourse or other gynecologic concerns.     Gynecologic History No LMP recorded. Patient is postmenopausal. Last Pap: Unsure of last pap  Last Mammogram: 12/2020.  Result was normal Last Colonoscopy: Cologard through PCP.  Result have been normal. Due this year.   Obstetric History OB History  Gravida Para Term Preterm AB Living  3         3  SAB IAB Ectopic Multiple Live Births               # Outcome Date GA Lbr Len/2nd Weight Sex Delivery Anes PTL Lv  3 Gravida 08/16/93          2 Gravida 09/05/86          1 Gravida 11/12/81            Past Medical History:  Diagnosis Date   Diabetes mellitus without complication (HCC)    Hyperlipemia    Hypertension     Past Surgical History:  Procedure Laterality Date   CHOLECYSTECTOMY  2021    Current Outpatient Medications on File Prior to Visit  Medication Sig Dispense Refill   aspirin EC 81 MG tablet Take 81 mg by mouth daily.     diphenhydrAMINE (BENADRYL) 25 mg capsule Take 50 mg by mouth at bedtime.     glimepiride (AMARYL) 4 MG tablet Take 4 mg by mouth daily.  5   Haloperidol Lactate (HALDOL IJ) Inject as directed every 30 (thirty) days.     lisinopril-hydrochlorothiazide (PRINZIDE,ZESTORETIC) 20-12.5 MG tablet Take 1 tablet by mouth daily.  5   naproxen sodium (ALEVE) 220 MG tablet Take 220 mg by mouth 2 (two) times daily as needed (pain).     pantoprazole (PROTONIX) 40 MG tablet Take 1 tablet (40 mg total) by mouth daily. 30 tablet 0   simvastatin (ZOCOR) 20 MG tablet Take 20 mg by mouth daily.  5   No current facility-administered medications on file prior to visit.    No Known Allergies  Social History:  reports that she  has quit smoking. Her smoking use included cigarettes. She has never used smokeless tobacco. She reports that she does not drink alcohol and does not use drugs.  History reviewed. No pertinent family history.  The following portions of the patient's history were reviewed and updated as appropriate: allergies, current medications, past family history, past medical history, past social history, past surgical history and problem list.  Review of Systems Pertinent items noted in HPI and remainder of comprehensive ROS otherwise negative.  Physical Exam:  BP 116/77    Pulse 94    Ht 5' 9.5" (1.765 m)    Wt 258 lb 9.6 oz (117.3 kg)    BMI 37.64 kg/m  CONSTITUTIONAL: Well-developed, well-nourished female in no acute distress.  HENT:  Normocephalic, atraumatic, External right and left ear normal.  EYES: Conjunctivae and EOM are normal. Pupils are equal, round, and reactive to light. No scleral icterus.  NECK: Normal range of motion, supple, no masses.  Normal thyroid.  SKIN: Skin is warm and dry. No rash noted. Not diaphoretic. No erythema. No pallor. MUSCULOSKELETAL: Normal range of motion. No tenderness.  No cyanosis, clubbing, or edema. NEUROLOGIC:  Alert and oriented to person, place, and time. Normal reflexes, muscle tone coordination.  PSYCHIATRIC: Normal mood and affect. Normal behavior. Normal judgment and thought content.  CARDIOVASCULAR: Normal heart rate noted, regular rhythm RESPIRATORY: Clear to auscultation bilaterally. Effort and breath sounds normal, no problems with respiration noted.  BREASTS: Symmetric in size. No masses, tenderness, skin changes, nipple drainage, or lymphadenopathy bilaterally. Performed in the presence of a chaperone. ABDOMEN: Soft, no distention noted.  No tenderness, rebound or guarding.  PELVIC: External genitalia normal, Vagina normal without discharge, Urethra without abnormality or discharge, no bladder tenderness, cervix normal in appearance, no CMT,  uterus normal size, shape, and consistency, no adnexal masses or tenderness, exam obscured by obesity. Performed in the presence of a chaperone.  Assessment and Plan:    1. Encounter for annual routine gynecological examination - Cervical cancer screening: Discussed guidelines. Pap with HPV done today - Breast Health: Encouraged self breast awareness/SBE. Discussed limits of clinical breast exam for detecting breast cancer. Discussed importance of annual MXR.  Rx given - Climacteric/Sexual health: Reviewed typical and atypical symptoms of menopause/peri-menopause. Discussed PMB and to call if any amount of spotting.  - Bone Health: Calcium via diet and supplementation. Discussed weight bearing exercise. DEXA due by age 44 - Colonoscopy:  She does cologard through her PCP - due this year.  - F/U 12 months and prn  Routine preventative health maintenance measures emphasized. Please refer to After Visit Summary for other counseling recommendations.   Milas Hock, MD, FACOG Obstetrician & Gynecologist, [facility] for [facility], [facility]

## 2021-09-28 ENCOUNTER — Telehealth: Payer: Self-pay

## 2021-09-28 LAB — CYTOLOGY - PAP
Comment: NEGATIVE
Diagnosis: NEGATIVE
High risk HPV: NEGATIVE

## 2021-09-28 NOTE — Telephone Encounter (Signed)
S/w pt and advised of results 

## 2022-01-26 ENCOUNTER — Ambulatory Visit
Admission: RE | Admit: 2022-01-26 | Discharge: 2022-01-26 | Disposition: A | Discharge status: Home / Self Care | Payer: Medicare Other | Source: Ambulatory Visit | Attending: Obstetrics and Gynecology | Admitting: Obstetrics and Gynecology

## 2022-01-26 DIAGNOSIS — Z01419 Encounter for gynecological examination (general) (routine) without abnormal findings: Secondary | ICD-10-CM

## 2023-01-03 ENCOUNTER — Other Ambulatory Visit: Payer: Self-pay | Admitting: Internal Medicine

## 2023-01-03 DIAGNOSIS — Z0000 Encounter for general adult medical examination without abnormal findings: Secondary | ICD-10-CM

## 2023-01-20 ENCOUNTER — Other Ambulatory Visit: Payer: Self-pay | Admitting: Internal Medicine

## 2023-01-23 LAB — T4, FREE: Free T4: 1.1 ng/dL (ref 0.8–1.8)

## 2023-01-23 LAB — COMPLETE METABOLIC PANEL WITH GFR
AG Ratio: 1.4 (calc) (ref 1.0–2.5)
ALT: 10 U/L (ref 6–29)
AST: 12 U/L (ref 10–35)
Albumin: 3.9 g/dL (ref 3.6–5.1)
Alkaline phosphatase (APISO): 91 U/L (ref 37–153)
BUN/Creatinine Ratio: 10 (calc) (ref 6–22)
BUN: 15 mg/dL (ref 7–25)
CO2: 24 mmol/L (ref 20–32)
Calcium: 9.1 mg/dL (ref 8.6–10.4)
Chloride: 102 mmol/L (ref 98–110)
Creat: 1.45 mg/dL — ABNORMAL HIGH (ref 0.50–1.05)
Globulin: 2.8 g/dL (calc) (ref 1.9–3.7)
Glucose, Bld: 148 mg/dL — ABNORMAL HIGH (ref 65–99)
Potassium: 3.8 mmol/L (ref 3.5–5.3)
Sodium: 137 mmol/L (ref 135–146)
Total Bilirubin: 0.7 mg/dL (ref 0.2–1.2)
Total Protein: 6.7 g/dL (ref 6.1–8.1)
eGFR: 41 mL/min/{1.73_m2} — ABNORMAL LOW (ref >=60)

## 2023-01-23 LAB — VITAMIN D 25 HYDROXY (VIT D DEFICIENCY, FRACTURES): Vit D, 25-Hydroxy: 52 ng/mL (ref 30–100)

## 2023-01-23 LAB — CBC
HCT: 39.6 % (ref 35.0–45.0)
Hemoglobin: 12.6 g/dL (ref 11.7–15.5)
MCH: 26.2 pg — ABNORMAL LOW (ref 27.0–33.0)
MCHC: 31.8 g/dL — ABNORMAL LOW (ref 32.0–36.0)
MCV: 82.3 fL (ref 80.0–100.0)
MPV: 10 fL (ref 7.5–12.5)
Platelets: 253 10*3/uL (ref 140–400)
RBC: 4.81 10*6/uL (ref 3.80–5.10)
RDW: 14.2 % (ref 11.0–15.0)
WBC: 8.9 10*3/uL (ref 3.8–10.8)

## 2023-01-23 LAB — T3: T3, Total: 92 ng/dL (ref 76–181)

## 2023-01-23 LAB — LIPID PANEL
Cholesterol: 132 mg/dL (ref <200)
HDL: 54 mg/dL (ref >=50)
LDL Cholesterol (Calc): 56 mg/dL (calc)
Non-HDL Cholesterol (Calc): 78 mg/dL (calc) (ref <130)
Total CHOL/HDL Ratio: 2.4 (calc) (ref <5.0)
Triglycerides: 139 mg/dL (ref <150)

## 2023-01-23 LAB — T3 UPTAKE: T3 Uptake: 28 % (ref 22–35)

## 2023-01-23 LAB — TSH: TSH: 5.78 mIU/L — ABNORMAL HIGH (ref 0.40–4.50)

## 2023-02-09 ENCOUNTER — Ambulatory Visit
Admission: RE | Admit: 2023-02-09 | Discharge: 2023-02-09 | Disposition: A | Discharge status: Home / Self Care | Payer: 59 | Source: Ambulatory Visit | Attending: Internal Medicine | Admitting: Internal Medicine

## 2023-02-09 DIAGNOSIS — Z0000 Encounter for general adult medical examination without abnormal findings: Secondary | ICD-10-CM

## 2023-02-14 ENCOUNTER — Other Ambulatory Visit: Payer: Self-pay

## 2023-02-14 ENCOUNTER — Emergency Department (HOSPITAL_BASED_OUTPATIENT_CLINIC_OR_DEPARTMENT_OTHER): Payer: 59 | Admitting: Radiology

## 2023-02-14 ENCOUNTER — Encounter (HOSPITAL_BASED_OUTPATIENT_CLINIC_OR_DEPARTMENT_OTHER): Payer: Self-pay

## 2023-02-14 ENCOUNTER — Emergency Department (HOSPITAL_BASED_OUTPATIENT_CLINIC_OR_DEPARTMENT_OTHER)
Admission: EM | Admit: 2023-02-14 | Discharge: 2023-02-14 | Disposition: A | Discharge status: Home / Self Care | Payer: 59 | Attending: Emergency Medicine | Admitting: Emergency Medicine

## 2023-02-14 DIAGNOSIS — E119 Type 2 diabetes mellitus without complications: Secondary | ICD-10-CM | POA: Insufficient documentation

## 2023-02-14 DIAGNOSIS — Z7982 Long term (current) use of aspirin: Secondary | ICD-10-CM | POA: Insufficient documentation

## 2023-02-14 DIAGNOSIS — M25512 Pain in left shoulder: Secondary | ICD-10-CM | POA: Insufficient documentation

## 2023-02-14 DIAGNOSIS — I10 Essential (primary) hypertension: Secondary | ICD-10-CM | POA: Diagnosis not present

## 2023-02-14 DIAGNOSIS — Z79899 Other long term (current) drug therapy: Secondary | ICD-10-CM | POA: Diagnosis not present

## 2023-02-14 MED ORDER — METHOCARBAMOL 500 MG PO TABS: 500.0000 mg | ORAL_TABLET | Freq: Two times a day (BID) | ORAL | 0 refills | Status: DC

## 2023-02-14 MED ORDER — METHOCARBAMOL 500 MG PO TABS
500.0000 mg | ORAL_TABLET | Freq: Once | ORAL | Status: AC
  Administered 2023-02-14: 500 mg via ORAL
  Filled 2023-02-14: qty 1

## 2023-02-14 NOTE — ED Notes (Signed)
RN reviewed discharge instructions with pt. Pt verbalized understanding and had no further questions. VSS upon discharge.  

## 2023-02-14 NOTE — Discharge Instructions (Addendum)
You have been seen today for your complaint of left shoulder pain. Your imaging was reassuring and showed no abnormalities. Your discharge medications include robaxin. This is a muscle relaxer. Take it as needed for pain. It may cause drowsiness. Do not drive or operate heavy machinery while taking this medication. Only take it at night until you know how it affects you. Follow up with: Dr. Shon Baton. He is an Investment banker, operational. Call to schedule an appointment for an ED follow up visit. Please seek immediate medical care if you develop any of the following symptoms: You have a new injury and your pain is worse or different. You feel numb or you have tingling in the painful area. At this time there does not appear to be the presence of an emergent medical condition, however there is always the potential for conditions to change. Please read and follow the below instructions.  Do not take your medicine if  develop an itchy rash, swelling in your mouth or lips, or difficulty breathing; call 911 and seek immediate emergency medical attention if this occurs.  You may review your lab tests and imaging results in their entirety on your MyChart account.  Please discuss all results of fully with your primary care provider and other specialist at your follow-up visit.  Note: Portions of this text may have been transcribed using voice recognition software. Every effort was made to ensure accuracy; however, inadvertent computerized transcription errors may still be present.

## 2023-02-14 NOTE — ED Notes (Signed)
Patient transported to X-ray 

## 2023-02-14 NOTE — ED Triage Notes (Signed)
Patient here POV from Home.  Endorses Left Deltoid/Shoulder Pain since Late May. Began after receiving IM Injection of Haldol. Mostly Constant. Worse with Movement. Better when flat.   NAD noted during Triage. A&Ox4. GCS 15. Ambulatory.

## 2023-02-14 NOTE — ED Provider Notes (Signed)
[place] EMERGENCY DEPARTMENT AT [facility] Provider Note   CSN: [id number] Arrival date & time: 02/14/23  1641     History  Chief Complaint  Patient presents with   Shoulder Pain    Laura Fitzgerald is a 62 y.o. female.  With history of hypertension, hyperlipidemia, diabetes who presents to the ED for evaluation of left shoulder pain.  She states she gets monthly H[facility].  She had an injection on May 28 and has had a significant aching pain since that time.  This pain is worse with any type of shoulder movement and improved with rest.  It does not radiate from the left shoulder.  She denies any chest pain or shortness of breath.  She denies any cardiac history.  Pain is localized to the area of the injection.  She denies any trauma or falls.  No numbness, weakness or tingling.  She has been getting monthly Haldol injections for "quite some time."   Shoulder Pain      Home Medications Prior to Admission medications   Medication Sig Start Date End Date Taking? Authorizing Provider  aspirin EC 81 MG tablet Take 81 mg by mouth daily.    [provider]  diphenhydrAMINE (BENADRYL) 25 mg capsule Take 50 mg by mouth at bedtime.    [provider]  glimepiride (AMARYL) 4 MG tablet Take 4 mg by mouth daily. 09/09/17   [provider]  Haloperidol Lactate (HALDOL IJ) Inject as directed every 30 (thirty) days.    [provider]  lisinopril-hydrochlorothiazide (PRINZIDE,ZESTORETIC) 20-12.5 MG tablet Take 1 tablet by mouth daily. 09/09/17   [provider]  methocarbamol (ROBAXIN) 500 MG tablet Take 1 tablet (500 mg total) by mouth 2 (two) times daily. 02/14/23  Yes Yardley Lekas, Edsel Petrin, PA-C  naproxen sodium (ALEVE) 220 MG tablet Take 220 mg by mouth 2 (two) times daily as needed (pain).    [provider]  pantoprazole (PROTONIX) 40 MG tablet Take 1 tablet (40 mg total) by  mouth daily. 09/24/17   Pricilla Loveless, MD  simvastatin (ZOCOR) 20 MG tablet Take 20 mg by mouth daily. 09/09/17   [provider]      Allergies    Patient has no known allergies.    Review of Systems   Review of Systems  Musculoskeletal:  Positive for arthralgias.    Physical Exam Updated Vital Signs BP (!) 170/99 (BP Location: Right Arm)   Pulse 94   Temp 98.1 F (36.7 C) (Oral)   Resp 18   Ht 5\' 9"  (1.753 m)   Wt 113.4 kg   SpO2 95%   BMI 36.92 kg/m  Physical Exam Vitals and nursing note reviewed.  Constitutional:      General: She is not in acute distress.    Appearance: Normal appearance. She is normal weight. She is not ill-appearing.     Comments: Resting comfortably in bed  HENT:     Head: Normocephalic and atraumatic.  Pulmonary:     Effort: Pulmonary effort is normal. No respiratory distress.  Abdominal:     General: Abdomen is flat.  Musculoskeletal:     Cervical back: Neck supple.     Comments: Grip strength 5 out of 5 bilaterally.  Decreased range in abduction to the left shoulder secondary to pain.  Normal anterior flexion range of motion.  Skin:    General: Skin is warm and dry.  Neurological:  Mental Status: She is alert and oriented to person, place, and time.  Psychiatric:        Mood and Affect: Mood normal.        Behavior: Behavior normal.     ED Results / Procedures / Treatments   Labs (all labs ordered are listed, but only abnormal results are displayed) Labs Reviewed - No data to display  EKG None  Radiology DG Shoulder Left  Result Date: 02/14/2023 CLINICAL DATA:  Atraumatic left shoulder pain. EXAM: LEFT SHOULDER - 2+ VIEW COMPARISON:  None Available. FINDINGS: There is no evidence of an acute fracture or dislocation. Moderate severity degenerative changes are seen involving the left acromioclavicular joint and left glenohumeral articulation. Numerous small injection granulomas are seen within the lateral soft tissues of  the left upper extremity. IMPRESSION: Moderate severity degenerative changes. Electronically Signed   By: Aram Candela M.D.   On: 02/14/2023 18:30    Procedures Procedures    Medications Ordered in ED Medications  methocarbamol (ROBAXIN) tablet 500 mg (500 mg Oral Given 02/14/23 1738)    ED Course/ Medical Decision Making/ A&P                             Medical Decision Making Amount and/or Complexity of Data Reviewed Radiology: ordered.  Risk Prescription drug management.  This patient presents to the ED for concern of left shoulder pain, this involves an extensive number of treatment options, and is a complaint that carries with it a high risk of complications and morbidity.  The differential diagnosis includes fracture, strain, sprain, contusion, dislocation, less likely cardiac in nature  My initial workup includes EKG, x-ray left shoulder  Additional history obtained from: Nursing notes from this visit.  I ordered imaging studies including x-ray left shoulder  I independently visualized and interpreted imaging which showed degenerative changes in the glenohumeral joint, injection granulomas I agree with the radiologist interpretation  Afebrile, hemodynamically stable.  62 year old female presents to the ED for evaluation of left shoulder pain.  This occurred immediately after receiving a Haldol injection on 02/01/2023.  She states that they injected it differently than they typically do.  Her pain is predictably reproducible with movements.  Likely has a deltoid strain versus a localized medication reaction versus localized trauma.  Very low suspicion for cardiac etiologies.  Patient reports significant improvement in his symptoms with Robaxin in the ED.  Will send a short course of Robaxin and encouraged her to follow-up with orthopedics.  She was given contact information for orthopedics.  She was given return precautions.  Stable at discharge.  At this time there does  not appear to be any evidence of an acute emergency medical condition and the patient appears stable for discharge with appropriate outpatient follow up. Diagnosis was discussed with patient who verbalizes understanding of care plan and is agreeable to discharge. I have discussed return precautions with patient and daughter who verbalizes understanding. Patient encouraged to follow-up with orthopedics. All questions answered.  Note: Portions of this report may have been transcribed using voice recognition software. Every effort was made to ensure accuracy; however, inadvertent computerized transcription errors may still be present.        Final Clinical Impression(s) / ED Diagnoses Final diagnoses:  Acute pain of left shoulder    Rx / DC Orders ED Discharge Orders          Ordered    methocarbamol (ROBAXIN) 500 MG tablet  2 times daily        02/14/23 1836              Mora Bellman 02/14/23 Threasa Beards, MD 02/15/23 [phone]

## 2023-02-18 IMAGING — MG MM DIGITAL SCREENING BILAT W/ TOMO AND CAD
8 series · 8 of 24 positions shown · non-contrast
Comparison: Previous exam(s).

CLINICAL DATA: Screening.

EXAM:
DIGITAL SCREENING BILATERAL MAMMOGRAM WITH TOMOSYNTHESIS AND CAD
TECHNIQUE: Bilateral screening digital craniocaudal and mediolateral oblique
mammograms were obtained. Bilateral screening digital breast
tomosynthesis was performed. The images were evaluated with
computer-aided detection.

[R MLO synth-2D]
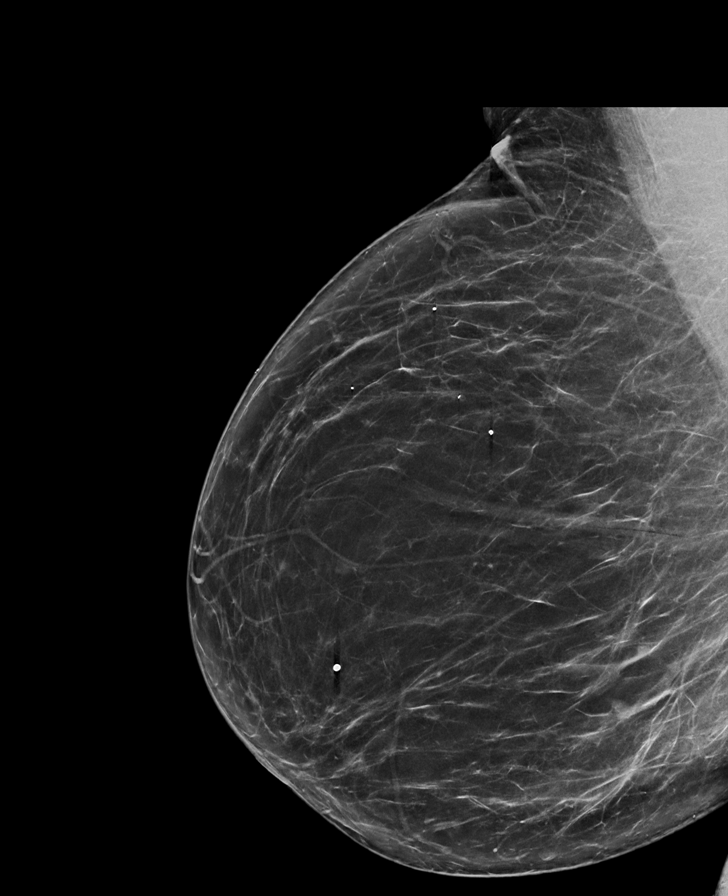

[L MLO synth-2D]
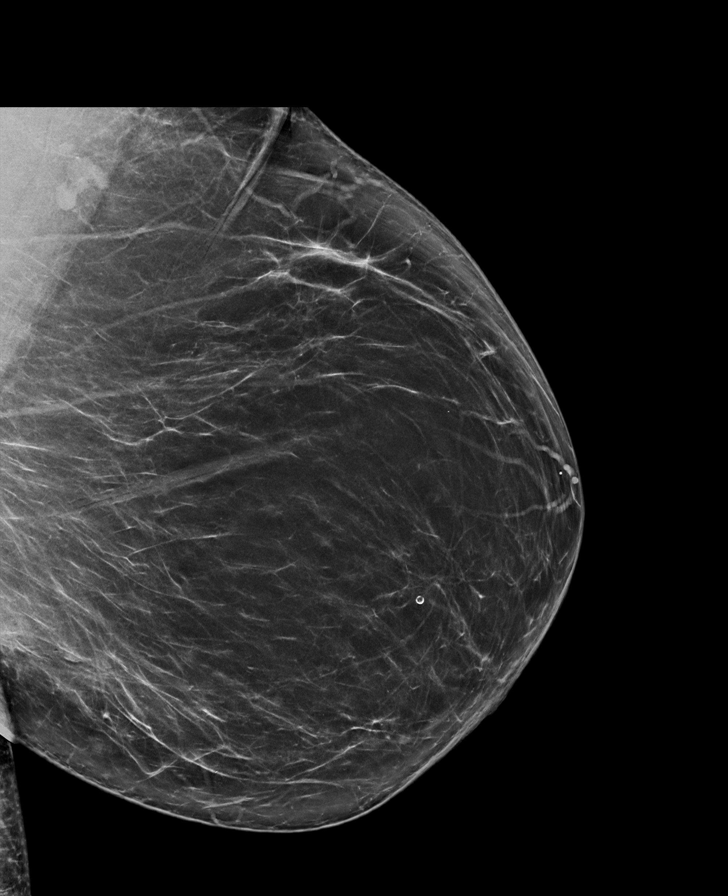

[R CC synth-2D]
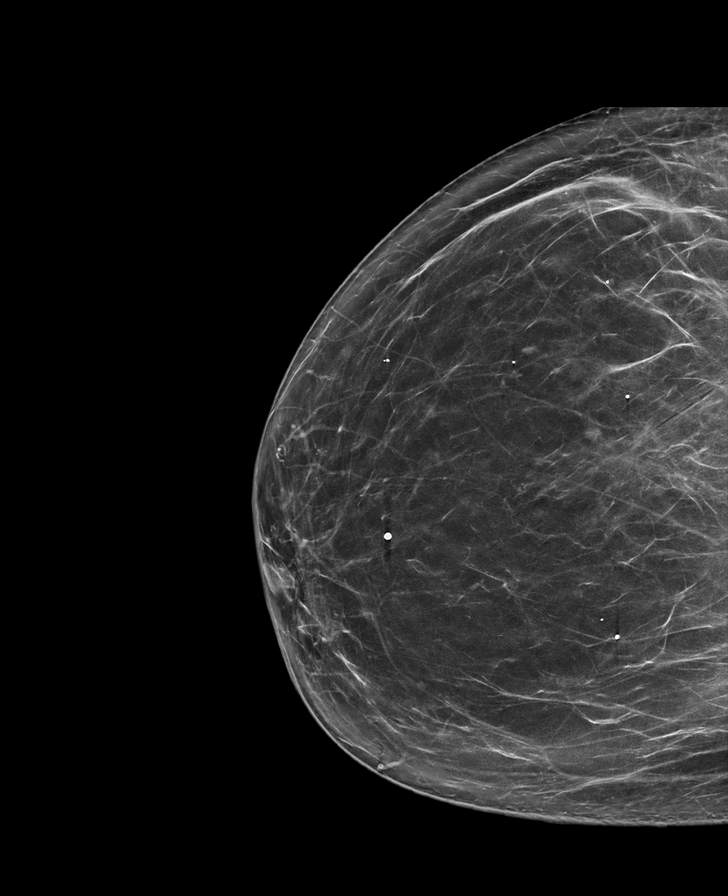

[L CC synth-2D]
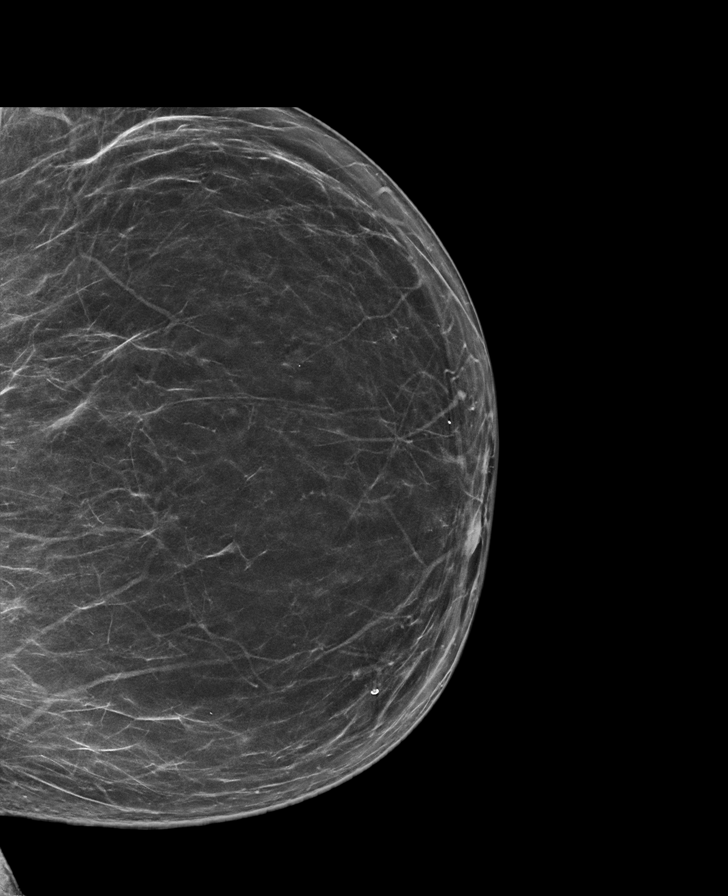

[R MLO tomo · tomo slice 41/81.0]
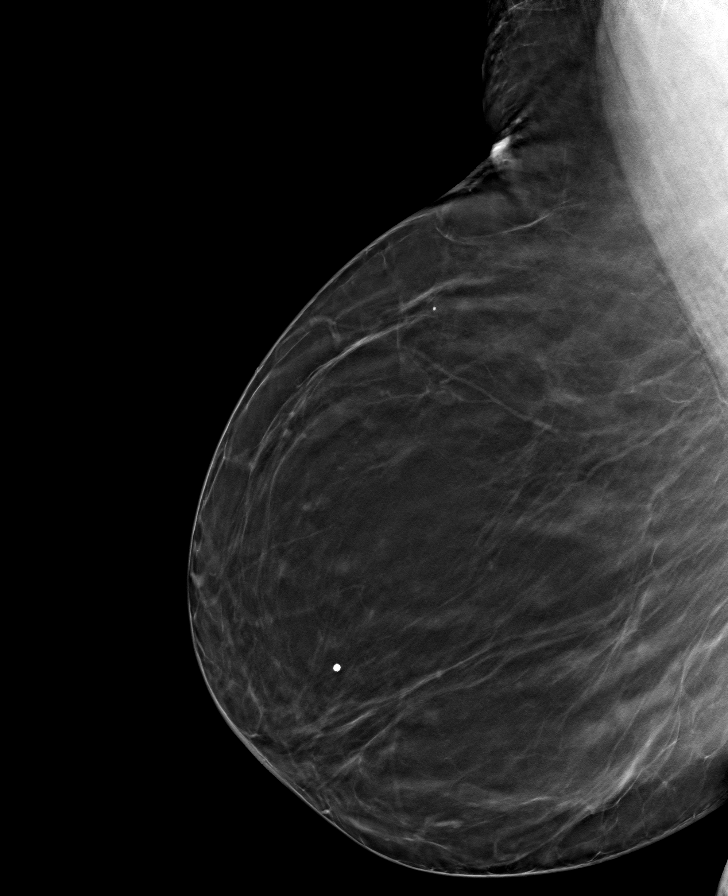

[L CC tomo · tomo slice 36/71.0]
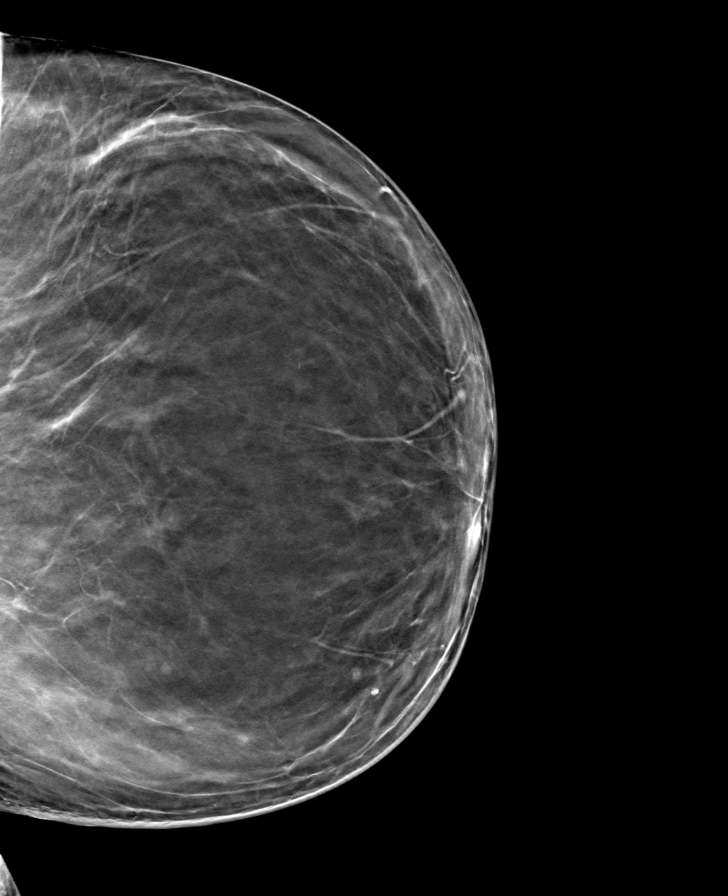

[L MLO tomo · tomo slice 43/86.0]
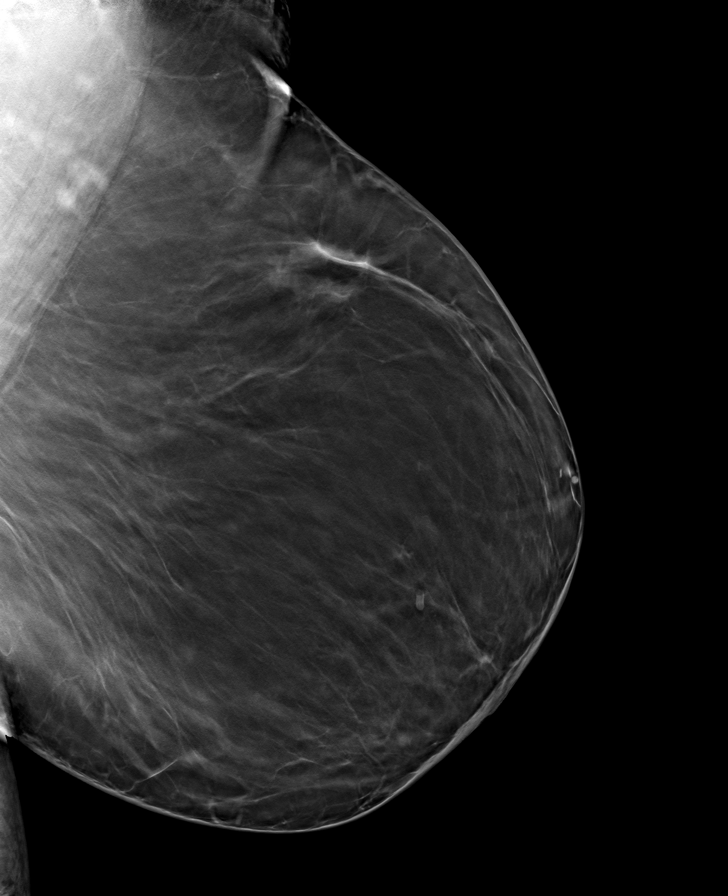

[R CC tomo · tomo slice 38/75.0]
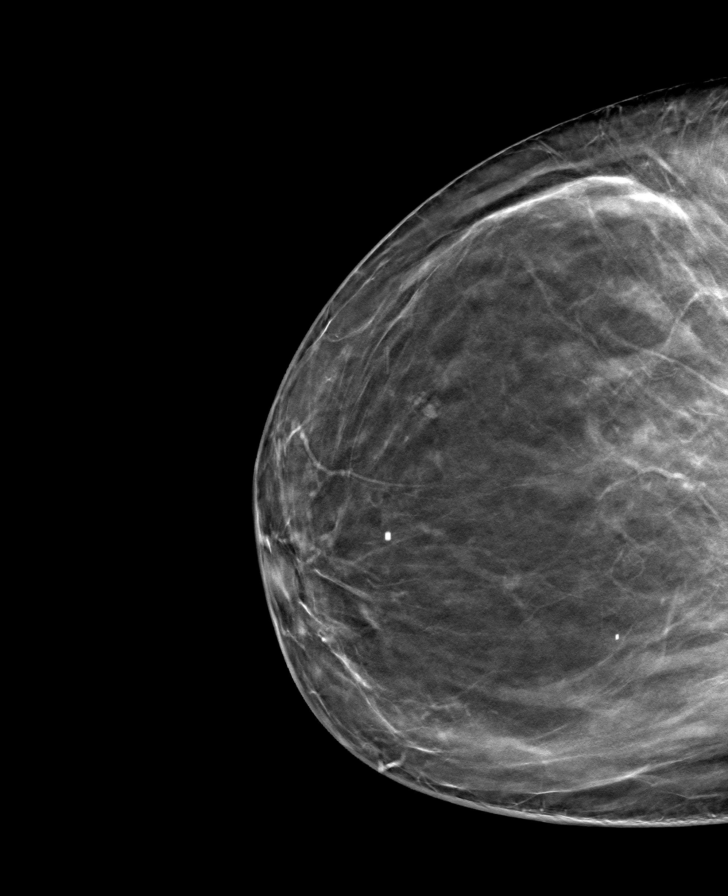

[8 of 24 positions shown; findings below may reference images not displayed]

ACR Breast Density Category b: There are scattered areas of
fibroglandular density.
FINDINGS: There are no findings suspicious for malignancy. The images were
evaluated with computer-aided detection.
IMPRESSION: No mammographic evidence of malignancy. A result letter of this
screening mammogram will be mailed directly to the patient.

RECOMMENDATION:
Screening mammogram in one year. (Code:WJ-I-BG6)

BI-RADS CATEGORY  1: Negative.

## 2023-02-28 ENCOUNTER — Encounter: Payer: Self-pay | Admitting: Obstetrics and Gynecology

## 2023-02-28 ENCOUNTER — Ambulatory Visit (INDEPENDENT_AMBULATORY_CARE_PROVIDER_SITE_OTHER): Payer: 59 | Admitting: Obstetrics and Gynecology

## 2023-02-28 VITALS — BP 155/83 | HR 77 | Ht 69.0 in | Wt 255.0 lb

## 2023-02-28 DIAGNOSIS — N930 Postcoital and contact bleeding: Secondary | ICD-10-CM | POA: Insufficient documentation

## 2023-02-28 DIAGNOSIS — N950 Postmenopausal bleeding: Secondary | ICD-10-CM | POA: Diagnosis not present

## 2023-02-28 NOTE — Progress Notes (Signed)
Laura Fitzgerald presents for PMB and PCB for the last 1-2 yrs. She reports episodes of spotting for 1-2 days each month, same time each month. She has also noted PCB. Denies any pain with IC. Last pap normal LMP in her 65's H/O HTN, followed by PCP H/O TSVD x 3  PE AF VSS Lungs clear Heart RRR Abd soft + BS  A/P PMB/PCB  Discussed with pt. Will check GYN U/S. F/U in 3-4 weeks to discuss results, possible pap smear and EMBX.

## 2023-02-28 NOTE — Progress Notes (Signed)
Pt presents to discuss postmenopausal bleeding. Describes light pink, and red spotting 3-4 times a month, every month.  Denies cramping, abdominal pain, pelvic pain. Is currently sexually active and notes post coital bleeding. Stopped having periods in her 25s.

## 2023-03-30 ENCOUNTER — Ambulatory Visit: Payer: 59 | Admitting: Obstetrics and Gynecology

## 2023-05-31 ENCOUNTER — Ambulatory Visit (HOSPITAL_COMMUNITY): Admission: RE | Admit: 2023-05-31 | Payer: 59 | Source: Ambulatory Visit

## 2023-06-08 ENCOUNTER — Ambulatory Visit (HOSPITAL_COMMUNITY)
Admission: RE | Admit: 2023-06-08 | Discharge: 2023-06-08 | Disposition: A | Discharge status: Home / Self Care | Payer: 59 | Source: Ambulatory Visit | Attending: Obstetrics and Gynecology | Admitting: Obstetrics and Gynecology

## 2023-06-08 DIAGNOSIS — N950 Postmenopausal bleeding: Secondary | ICD-10-CM | POA: Diagnosis present

## 2023-06-22 ENCOUNTER — Other Ambulatory Visit (HOSPITAL_COMMUNITY)
Admission: RE | Admit: 2023-06-22 | Discharge: 2023-06-22 | Disposition: A | Discharge status: Home / Self Care | Payer: 59 | Source: Ambulatory Visit | Attending: Obstetrics and Gynecology | Admitting: Obstetrics and Gynecology

## 2023-06-22 ENCOUNTER — Ambulatory Visit: Payer: 59 | Admitting: Obstetrics and Gynecology

## 2023-06-22 ENCOUNTER — Encounter: Payer: Self-pay | Admitting: Obstetrics and Gynecology

## 2023-06-22 VITALS — BP 131/83 | HR 78 | Ht 69.0 in | Wt 259.0 lb

## 2023-06-22 DIAGNOSIS — N950 Postmenopausal bleeding: Secondary | ICD-10-CM | POA: Diagnosis present

## 2023-06-22 DIAGNOSIS — N8501 Benign endometrial hyperplasia: Secondary | ICD-10-CM

## 2023-06-22 NOTE — Progress Notes (Signed)
      GYNECOLOGY OFFICE PROCEDURE NOTE   Laura Fitzgerald is a 62 y.o. G3P0 here for endometrial biopsy for PMB. Recent ultrasound also showed EL 1 cm.  Today, she reports no concerning symptoms. Of note, pap on 09/2021 was normal, negative HPV.   ENDOMETRIAL BIOPSY     The indications for endometrial biopsy were reviewed.   Risks of the biopsy including cramping, bleeding, infection, uterine perforation, inadequate specimen and need for additional procedures were discussed. Offered alternative of hysteroscopy, dilation and curettage in OR. The patient states she understands the R/B/I/A and agrees to undergo procedure today. Urine pregnancy test was Not indicated. Consent was signed. Time out was performed.    Patient was positioned in dorsal lithotomy position. A vaginal speculum was placed.  The cervix was visualized and was prepped with Betadine.  A single-toothed tenaculum was placed on the anterior lip of the cervix to stabilize it. The 3 mm pipelle was easily introduced into the endometrial cavity without difficulty to a depth of 8 cm, and a Moderate amount of tissue was obtained after two passes and sent to pathology. The instruments were removed from the patient's vagina. Minimal bleeding from the cervix was noted. The patient tolerated the procedure well.   Patient was given post procedure instructions.  Will follow up pathology and manage accordingly; patient will be contacted with results and recommendations.  Routine preventative health maintenance measures emphasized.    Milas Hock, MD, FACOG Obstetrician & Gynecologist, [facility] for [facility], [facility]

## 2023-06-24 LAB — SURGICAL PATHOLOGY

## 2023-06-27 DIAGNOSIS — N8501 Benign endometrial hyperplasia: Secondary | ICD-10-CM | POA: Insufficient documentation

## 2023-06-27 MED ORDER — MEGESTROL ACETATE 40 MG PO TABS
40.0000 mg | ORAL_TABLET | Freq: Two times a day (BID) | ORAL | 5 refills | Status: DC
Start: 2023-06-27 — End: 2024-01-20

## 2023-06-27 NOTE — Addendum Note (Signed)
Addended by: Milas Hock A on: 06/27/2023 12:40 PM   Modules accepted: Orders

## 2023-06-28 ENCOUNTER — Telehealth: Payer: Self-pay

## 2023-06-28 NOTE — Telephone Encounter (Signed)
Patient called and left message regarding a prescription.   Returned patient call. No answer. Left vm for patient to return call to the office

## 2023-10-10 ENCOUNTER — Other Ambulatory Visit (HOSPITAL_COMMUNITY)
Admission: RE | Admit: 2023-10-10 | Discharge: 2023-10-10 | Disposition: A | Discharge status: Home / Self Care | Payer: 59 | Source: Ambulatory Visit | Attending: Obstetrics & Gynecology | Admitting: Obstetrics & Gynecology

## 2023-10-10 ENCOUNTER — Ambulatory Visit (INDEPENDENT_AMBULATORY_CARE_PROVIDER_SITE_OTHER): Payer: 59 | Admitting: Obstetrics & Gynecology

## 2023-10-10 VITALS — BP 150/91 | Ht 69.0 in | Wt 250.8 lb

## 2023-10-10 DIAGNOSIS — N950 Postmenopausal bleeding: Secondary | ICD-10-CM | POA: Diagnosis present

## 2023-10-10 DIAGNOSIS — N8501 Benign endometrial hyperplasia: Secondary | ICD-10-CM | POA: Diagnosis present

## 2023-10-10 NOTE — Progress Notes (Signed)
    GYNECOLOGY PROGRESS NOTE  Subjective:    Patient ID: Laura Fitzgerald, female    DOB: Mar 02, 1961, 63 y.o.   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 She has had PMB for several years and was evaluated 06/2023 with ultrasound that showed 1.1 cm endometrial lining. An EMBX showed the above mentioned pathology. She opted for treatment with megace 40 mg BID. She reports that her PMB initially stopped but restarted last month, sometimes light and sometimes heavier.   The following portions of the patient's history were reviewed and updated as appropriate: allergies, current medications, past family history, past medical history, past social history, past surgical history, and problem list.  Review of Systems Pertinent items are noted in HPI.  Pap smear was normal 2023  Objective:   Blood pressure (!) 150/91, height 5\' 9"  (1.753 m), weight 250 lb 12.8 oz (113.8 kg). Body mass index is 37.04 kg/m. Well nourished, well hydrated Black female, no apparent distress She is ambulating and conversing normally. She has a resting fine tremor.   Consent signed, time out done Cervix prepped with betadine and sprayed with Hurricaine spray. I then grasped the anterior lip with a single tooth tenaculum. Uterus sounded to 10 cm Pipelle used for e passes with a very large amount of tissue obtained. She tolerated the procedure well.     Assessment:   1. Complex endometrial hyperplasia without atypia      Plan:   1. Complex endometrial hyperplasia without atypia (Primary) - await pathology - She will come back in a week for discussion of results

## 2023-10-10 NOTE — Progress Notes (Signed)
Pt. Presents for endo biopsy.

## 2023-10-12 LAB — SURGICAL PATHOLOGY

## 2023-10-17 ENCOUNTER — Other Ambulatory Visit: Payer: Self-pay

## 2023-10-17 ENCOUNTER — Ambulatory Visit: Payer: 59 | Admitting: Obstetrics & Gynecology

## 2023-10-17 ENCOUNTER — Encounter (HOSPITAL_COMMUNITY): Payer: Self-pay

## 2023-10-17 ENCOUNTER — Emergency Department (HOSPITAL_COMMUNITY)
Admission: EM | Admit: 2023-10-17 | Discharge: 2023-10-17 | Disposition: A | Discharge status: Home / Self Care | Payer: 59 | Attending: Emergency Medicine | Admitting: Emergency Medicine

## 2023-10-17 VITALS — BP 185/93 | HR 88 | Ht 69.0 in | Wt 252.1 lb

## 2023-10-17 DIAGNOSIS — D72829 Elevated white blood cell count, unspecified: Secondary | ICD-10-CM | POA: Insufficient documentation

## 2023-10-17 DIAGNOSIS — Z79899 Other long term (current) drug therapy: Secondary | ICD-10-CM | POA: Diagnosis not present

## 2023-10-17 DIAGNOSIS — I10 Essential (primary) hypertension: Secondary | ICD-10-CM | POA: Diagnosis present

## 2023-10-17 DIAGNOSIS — E871 Hypo-osmolality and hyponatremia: Secondary | ICD-10-CM | POA: Diagnosis not present

## 2023-10-17 DIAGNOSIS — Z7982 Long term (current) use of aspirin: Secondary | ICD-10-CM | POA: Insufficient documentation

## 2023-10-17 DIAGNOSIS — E876 Hypokalemia: Secondary | ICD-10-CM | POA: Diagnosis not present

## 2023-10-17 DIAGNOSIS — N8501 Benign endometrial hyperplasia: Secondary | ICD-10-CM

## 2023-10-17 LAB — BASIC METABOLIC PANEL
Anion gap: 12 (ref 5–15)
BUN: 21 mg/dL (ref 8–23)
CO2: 22 mmol/L (ref 22–32)
Calcium: 9.6 mg/dL (ref 8.9–10.3)
Chloride: 100 mmol/L (ref 98–111)
Creatinine, Ser: 1.56 mg/dL — ABNORMAL HIGH (ref 0.44–1.00)
GFR, Estimated: 37 mL/min — ABNORMAL LOW (ref >60)
Glucose, Bld: 178 mg/dL — ABNORMAL HIGH (ref 70–99)
Potassium: 3.2 mmol/L — ABNORMAL LOW (ref 3.5–5.1)
Sodium: 134 mmol/L — ABNORMAL LOW (ref 135–145)

## 2023-10-17 LAB — CBC WITH DIFFERENTIAL/PLATELET
Abs Immature Granulocytes: 0.07 10*3/uL (ref 0.00–0.07)
Basophils Absolute: 0 10*3/uL (ref 0.0–0.1)
Basophils Relative: 0 %
Eosinophils Absolute: 0.2 10*3/uL (ref 0.0–0.5)
Eosinophils Relative: 2 %
HCT: 38.2 % (ref 36.0–46.0)
Hemoglobin: 12 g/dL (ref 12.0–15.0)
Immature Granulocytes: 1 %
Lymphocytes Relative: 17 %
Lymphs Abs: 2 10*3/uL (ref 0.7–4.0)
MCH: 26.4 pg (ref 26.0–34.0)
MCHC: 31.4 g/dL (ref 30.0–36.0)
MCV: 84.1 fL (ref 80.0–100.0)
Monocytes Absolute: 0.6 10*3/uL (ref 0.1–1.0)
Monocytes Relative: 5 %
Neutro Abs: 8.9 10*3/uL — ABNORMAL HIGH (ref 1.7–7.7)
Neutrophils Relative %: 75 %
Platelets: 291 10*3/uL (ref 150–400)
RBC: 4.54 MIL/uL (ref 3.87–5.11)
RDW: 14.3 % (ref 11.5–15.5)
WBC: 11.9 10*3/uL — ABNORMAL HIGH (ref 4.0–10.5)
nRBC: 0 % (ref 0.0–0.2)

## 2023-10-17 MED ORDER — POTASSIUM CHLORIDE CRYS ER 20 MEQ PO TBCR: 20.0000 meq | EXTENDED_RELEASE_TABLET | Freq: Two times a day (BID) | ORAL | 0 refills | Status: DC

## 2023-10-17 NOTE — Progress Notes (Signed)
    GYNECOLOGY PROGRESS NOTE  Subjective:    Patient ID: Laura Fitzgerald, female    DOB: Sep 04, 1961, 63 y.o.   MRN: [id number]  HPI  Patient is a 63 yo divorced P3 here to discuss her EMBX results. 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 She opted for treatment with megace  40 mg BID initially. She continues to have bleeding and is requesting a hysterectomy for treatment. This was offered to her as one of the treatments for her hyperplasia. I repeated the biopsy last week and the results are as follows:  FINAL MICROSCOPIC DIAGNOSIS:   A. ENDOMETRIUM, BIOPSY:  Focal endometrial hyperplasia without atypia in a background of inactive  glands with stromal progestational changes.  Negative for endometrial intraepithelial neoplasia (EIN) and malignancy.   The following portions of the patient's history were reviewed and updated as appropriate: allergies, current medications, past family history, past medical history, past social history, past surgical history, and problem list.  Review of Systems Pertinent items are noted in HPI.   Objective:   Blood pressure (!) 185/93, pulse 88, height 5\' 9"  (1.753 m), weight 252 lb 1.6 oz (114.4 kg). Body mass index is 37.23 kg/m. Well nourished, well hydrated Black female, no apparent distress She is ambulating and conversing normally. Exam deferred   Assessment:  PMB with uterine hyperplasia Uncontrolled HTN  Plan:   She is interested in hysterectomy and will have a consult in the near future. In the meantime she is going to the ER for BP management in the short term and will follow up with her primary care for long term management of her HTN.

## 2023-10-17 NOTE — ED Provider Triage Note (Signed)
 Emergency Medicine Provider Triage Evaluation Note  Laura Fitzgerald , a 63 y.o. female  was evaluated in triage.  Pt complains of HTN. Was seen by OBGYN today, and had a BP of 190 SBP and 185 SPB on repeat, was told to come here. Asymptomatic.   Denies headaches, vision changes, fevers, vertigo, blurry vision, chest pain, shortness of breath, abdominal pain, n/v/d, dysuria, hematuria, melena, hematochezia, LE pain, LE swelling.   Review of Systems  Positive: N/a Negative: N/a  Physical Exam  BP (!) 158/96   Pulse 96   Temp 98.1 F (36.7 C) (Oral)   Resp 18   Ht 5\' 9"  (1.753 m)   Wt 113.9 kg   SpO2 100%   BMI 37.08 kg/m  Gen:   Awake, no distress   Resp:  Normal effort  MSK:   Moves extremities without difficulty  Other:    Medical Decision Making  Medically screening exam initiated at 7:19 PM.  Appropriate orders placed.  Laura Fitzgerald was informed that the remainder of the evaluation will be completed by another provider, this initial triage assessment does not replace that evaluation, and the importance of remaining in the ED until their evaluation is complete.     Hayes Lipps, [place] 10/17/23 [phone]

## 2023-10-17 NOTE — ED Triage Notes (Signed)
 Pt states she was at [facility] for a checkup and BP was 185/82 and they sent her here. BP is 158/96 on arrival. Pt denies headache, denies pain. Takes BP meds as prescribed

## 2023-10-17 NOTE — Discharge Instructions (Addendum)
 Your evaluated in the emergency room for elevated blood pressure.  You had mildly low potassium.  A prescription for potassium was sent to your pharmacy.  Otherwise your lab work did not show any significant abnormality.  Please follow-up with your primary care doctor within the next 3 days to ensure your symptoms are improving.  Please continue your blood pressure medication as prescribed by your primary care doctor.  If you experience any new or worsening symptoms including dizziness, chest pain, shortness of breath, blurry vision please return to the emergency room.

## 2023-10-17 NOTE — ED Provider Notes (Signed)
[place] EMERGENCY DEPARTMENT AT [facility] Provider Note   CSN: [id number] Arrival date & time: 10/17/23  1749     History  Chief Complaint  Patient presents with   Hypertension    Laura Fitzgerald is a 63 y.o. female who presents with elevated blood pressure.  Patient reports that earlier today she had a systolic of 185.  Throughout the whole day she has remained asymptomatic.  Here today her systolics are in the 150s.  She has remained asymptomatic here.  Denies any chest pain, shortness of breath, dizziness, blurry vision, weakness.  She is able to ambulate without difficulty.  She has been consistent with her blood pressure medication and has close follow-up with her primary care doctor.   Hypertension       Home Medications Prior to Admission medications   Medication Sig Start Date End Date Taking? Authorizing Provider  aspirin EC 81 MG tablet Take 81 mg by mouth daily.    [provider]  diphenhydrAMINE (BENADRYL) 25 mg capsule Take 50 mg by mouth at bedtime.    [provider]  FARXIGA 10 MG TABS tablet Take 10 mg by mouth daily. 12/31/22   [provider]  glimepiride (AMARYL) 4 MG tablet Take 4 mg by mouth daily. 09/09/17   [provider]  Haloperidol Lactate (HALDOL IJ) Inject as directed every 30 (thirty) days.    [provider]  lisinopril-hydrochlorothiazide (PRINZIDE,ZESTORETIC) 20-12.5 MG tablet Take 1 tablet by mouth daily. 09/09/17   [provider]  megestrol (MEGACE) 40 MG tablet Take 1 tablet (40 mg total) by mouth 2 (two) times daily. Can increase to two tablets twice a day in the event of heavy bleeding 06/27/23   Milas Hock, MD  methocarbamol (ROBAXIN) 500 MG tablet Take 1 tablet (500 mg total) by mouth 2 (two) times daily. 02/14/23   Schutt, Edsel Petrin, PA-C  pantoprazole (PROTONIX) 40 MG tablet Take 1 tablet (40 mg total) by mouth daily. 09/24/17   Pricilla Loveless, MD  simvastatin  (ZOCOR) 20 MG tablet Take 20 mg by mouth daily. 09/09/17   [provider]      Allergies    Patient has no known allergies.    Review of Systems   Review of Systems  All other systems reviewed and are negative.   Physical Exam Updated Vital Signs BP (!) 158/96   Pulse 96   Temp 98.1 F (36.7 C) (Oral)   Resp 18   Ht 5\' 9"  (1.753 m)   Wt 113.9 kg   SpO2 100%   BMI 37.08 kg/m  Physical Exam Vitals and nursing note reviewed.  Constitutional:      General: She is not in acute distress.    Appearance: She is well-developed.  HENT:     Head: Normocephalic and atraumatic.  Eyes:     Conjunctiva/sclera: Conjunctivae normal.  Cardiovascular:     Rate and Rhythm: Normal rate and regular rhythm.     Heart sounds: No murmur heard. Pulmonary:     Effort: Pulmonary effort is normal. No respiratory distress.     Breath sounds: Normal breath sounds. No wheezing or rales.  Abdominal:     Palpations: Abdomen is soft.     Tenderness: There is no abdominal tenderness.  Musculoskeletal:        General: No swelling.     Cervical back: Neck supple.  Skin:    General: Skin is warm and dry.     Capillary Refill:  Capillary refill takes less than 2 seconds.  Neurological:     Mental Status: She is alert.  Psychiatric:        Mood and Affect: Mood normal.     ED Results / Procedures / Treatments   Labs (all labs ordered are listed, but only abnormal results are displayed) Labs Reviewed  CBC WITH DIFFERENTIAL/PLATELET - Abnormal; Notable for the following components:      Result Value   WBC 11.9 (*)    Neutro Abs 8.9 (*)    All other components within normal limits  BASIC METABOLIC PANEL - Abnormal; Notable for the following components:   Sodium 134 (*)    Potassium 3.2 (*)    Glucose, Bld 178 (*)    Creatinine, Ser 1.56 (*)    GFR, Estimated 37 (*)    All other components within normal limits    EKG None  Radiology No results found.  Procedures Procedures     Medications Ordered in ED Medications - No data to display  ED Course/ Medical Decision Making/ A&P                                 Medical Decision Making  This patient presents to the ED with chief complaint(s) of elevated blood pressure .  The complaint involves an extensive differential diagnosis and also carries with it a high risk of complications and morbidity.   pertinent past medical history as listed in HPI  The differential diagnosis includes  Asymptomatic hypertension, CVA, TIA, ACS, PE, aortic dissection, endorgan damage  Additional history obtained:  Records reviewed previous admission documents and Care Everywhere/External Records  Initial Assessment:   Patient presents hypertensive to 158/96.  She is otherwise hemodynamically stable.  She is asymptomatic.  Her exam is entirely benign.  Low suspicion for CVA, TIA, ACS, PE.   Independent ECG interpretation:  none  Independent labs interpretation:  The following labs were independently interpreted:  Mild hypokalemia of 3.2 and mild hyponatremia of 134, creatinine of 1.56 at baseline, CBC with mild leukocytosis of 11.9  Independent visualization and interpretation of imaging: none  Treatment and Reassessment: none  Consultations obtained:   none  Disposition:   Patient will be discharged home.  Short prescription of potassium supplementation sent into pharmacy.  Encouraged to follow-up with primary care doctor within the next week.  The patient has been appropriately medically screened and/or stabilized in the ED. I have low suspicion for any other emergent medical condition which would require further screening, evaluation or treatment in the ED or require inpatient management. At time of discharge the patient is hemodynamically stable and in no acute distress. I have discussed work-up results and diagnosis with patient and answered all questions. Patient is agreeable with discharge plan. We discussed strict  return precautions for returning to the emergency department and they verbalized understanding.     Social Determinants of Health:   none  This note was dictated with voice recognition software.  Despite best efforts at proofreading, errors may have occurred which can change the documentation meaning.          Final Clinical Impression(s) / ED Diagnoses Final diagnoses:  Hypertension, unspecified type    Rx / DC Orders ED Discharge Orders     None         Fabienne Bruns 10/17/23 2333    Bethann Berkshire, MD 10/19/23 1436

## 2023-10-17 NOTE — Progress Notes (Signed)
 Pt. Presents for follow up. Pt. Blood pressure was elevated, first reading was 194/102 2nd reading was 198/104, 3rd reading was 185/93. Pt. Stated that she did not take her medication and did not eat.

## 2023-10-19 ENCOUNTER — Ambulatory Visit
Admission: EM | Admit: 2023-10-19 | Discharge: 2023-10-19 | Disposition: A | Discharge status: Home / Self Care | Payer: 59 | Attending: Family Medicine | Admitting: Family Medicine

## 2023-10-19 DIAGNOSIS — I10 Essential (primary) hypertension: Secondary | ICD-10-CM

## 2023-10-19 NOTE — Discharge Instructions (Addendum)
Increase your amlodipine from 2.5 mg a day to 5 mg a day.  Continue your losartan and metoprolol as prescribed.  Keep a blood pressure log checking your blood pressure twice daily and take this to your PCP appointment on February 20 for further evaluation and treatment options for your blood pressure.  Try to decrease your salt intake which includes fried foods or eating out.

## 2023-10-19 NOTE — ED Provider Notes (Signed)
 [facility]    CSN: [id number] Arrival date & time: 10/19/23  1753      History   Chief Complaint No chief complaint on file.   HPI Laura Fitzgerald is a 63 y.o. female presents for elevated blood pressure.  Patient has a history of hypertension and currently takes metoprolol XL 25 mg daily losartan 25 mg daily, and amlodipine 2.5 mg daily.  States her blood pressure normally runs 130s to 40s over 70s but recently it has been running as high as 190s over 80s.  She is asymptomatic and denies any chest pain, shortness of breath, headaches, dizziness, visual changes.  She does endorse that she has been eating out more and does eat more fried foods than normal.  Denies any lower extremity swelling.  She does have a PCP and has an appoint with them on February 20.  She was also seen in the emergency room on February 10 for the same complaint.  Blood work was done showing some hypokalemia and she was sent home on potassium supplements but no changes in her medications were made.  No other concerns at this time.  HPI  Past Medical History:  Diagnosis Date   Chronic kidney disease    Diabetes mellitus without complication (HCC)    Hyperlipemia    Hypertension     Patient Active Problem List   Diagnosis Date Noted   Complex endometrial hyperplasia without atypia 06/27/2023   PMB (postmenopausal bleeding) 02/28/2023   PCB (post coital bleeding) 02/28/2023   Family history of colon cancer in mother 09/28/2017   DM (diabetes mellitus) (HCC) 09/28/2017   Hypercholesterolemia 09/28/2017   ANXIETY 12/03/2009   HYPERTENSION 12/03/2009   HYPOKALEMIA, HX OF 12/03/2009   SYNCOPE 11/28/2009    Past Surgical History:  Procedure Laterality Date   CHOLECYSTECTOMY  2021    OB History     Gravida  3   Para      Term      Preterm      AB      Living  3      SAB      IAB      Ectopic      Multiple      Live Births               Home Medications    Prior  to Admission medications   Medication Sig Start Date End Date Taking? Authorizing Provider  amLODipine (NORVASC) 2.5 MG tablet Take 2.5 mg by mouth daily. 08/22/23  Yes [provider]  aspirin EC 81 MG tablet Take 81 mg by mouth daily.   Yes [provider]  cholecalciferol (VITAMIN D3) 25 MCG (1000 UNIT) tablet Take 1,000 Units by mouth daily.   Yes [provider]  FARXIGA 10 MG TABS tablet Take 10 mg by mouth daily. 12/31/22  Yes [provider]  losartan (COZAAR) 25 MG tablet Take 25 mg by mouth daily. 08/22/23  Yes [provider]  megestrol (MEGACE) 40 MG tablet Take 1 tablet (40 mg total) by mouth 2 (two) times daily. Can increase to two tablets twice a day in the event of heavy bleeding 06/27/23  Yes Milas Hock, MD  metoprolol succinate (TOPROL-XL) 25 MG 24 hr tablet Take 25 mg by mouth daily. 09/12/23  Yes [provider]  simvastatin (ZOCOR) 20 MG tablet Take 20 mg by mouth daily. 09/09/17  Yes [provider]  diphenhydrAMINE (BENADRYL) 25 mg capsule Take 50  mg by mouth at bedtime.    [provider]  glimepiride (AMARYL) 4 MG tablet Take 4 mg by mouth daily. 09/09/17   [provider]  Haloperidol Lactate (HALDOL IJ) Inject as directed every 30 (thirty) days.    [provider]  methocarbamol (ROBAXIN) 500 MG tablet Take 1 tablet (500 mg total) by mouth 2 (two) times daily. 02/14/23   Schutt, Edsel Petrin, PA-C  pantoprazole (PROTONIX) 40 MG tablet Take 1 tablet (40 mg total) by mouth daily. 09/24/17   Pricilla Loveless, MD  potassium chloride SA (KLOR-CON M) 20 MEQ tablet Take 1 tablet (20 mEq total) by mouth 2 (two) times daily. 10/17/23   Halford Decamp, PA-C    Family History History reviewed. No pertinent family history.  Social History Social History   Tobacco Use   Smoking status: Former    Types: Cigarettes   Smokeless tobacco: Never   Tobacco comments:    Pt states that she quit  smoking about 40 years ago  Substance Use Topics   Alcohol use: No   Drug use: No     Allergies   Patient has no known allergies.   Review of Systems Review of Systems  Cardiovascular:        Elevated blood pressure readings     Physical Exam Triage Vital Signs ED Triage Vitals  Encounter Vitals Group     BP 10/19/23 1823 (!) 165/95     Systolic BP Percentile --      Diastolic BP Percentile --      Pulse Rate 10/19/23 1823 (!) 102     Resp 10/19/23 1823 18     Temp 10/19/23 1823 98.1 F (36.7 C)     Temp Source 10/19/23 1823 Oral     SpO2 10/19/23 1823 97 %     Weight --      Height --      Head Circumference --      Peak Flow --      Pain Score 10/19/23 1816 0     Pain Loc --      Pain Education --      Exclude from Growth Chart --    No data found.  Updated Vital Signs BP (!) 165/95 (BP Location: Right Arm)   Pulse (!) 102   Temp 98.1 F (36.7 C) (Oral)   Resp 18   SpO2 97%   Visual Acuity Right Eye Distance:   Left Eye Distance:   Bilateral Distance:    Right Eye Near:   Left Eye Near:    Bilateral Near:     Physical Exam Vitals and nursing note reviewed.  Constitutional:      General: She is not in acute distress.    Appearance: Normal appearance. She is not ill-appearing.  HENT:     Head: Normocephalic and atraumatic.  Eyes:     Pupils: Pupils are equal, round, and reactive to light.  Cardiovascular:     Rate and Rhythm: Normal rate and regular rhythm.     Heart sounds: Normal heart sounds.  Pulmonary:     Effort: Pulmonary effort is normal.     Breath sounds: Normal breath sounds.  Skin:    General: Skin is warm and dry.  Neurological:     General: No focal deficit present.     Mental Status: She is alert and oriented to person, place, and time.  Psychiatric:        Mood and Affect: Mood normal.  Behavior: Behavior normal.      UC Treatments / Results  Labs (all labs ordered are listed, but only abnormal results are  displayed) Labs Reviewed - No data to display  EKG   Radiology No results found.  Procedures Procedures (including critical care time)  Medications Ordered in UC Medications - No data to display  Initial Impression / Assessment and Plan / UC Course  I have reviewed the triage vital signs and the nursing notes.  Pertinent labs & imaging results that were available during my care of the patient were reviewed by me and considered in my medical decision making (see chart for details).     Reviewed symptoms and exam with patient.  Blood pressure slightly elevated at 165/95 in clinic.  She is asymptomatic.  Will have her increase her Norvasc from 2.5 to 5 mg and keep a blood pressure log checking it twice daily and take this to her PCP appointment on February 20.  Did discuss DASH diet and reduction of salt as this is likely contributing to her elevated readings.  She will follow-up with her PCP at her scheduled appointment next week.  ER precautions reviewed and patient verbalized understanding Final Clinical Impressions(s) / UC Diagnoses   Final diagnoses:  Hypertension, unspecified type     Discharge Instructions      Increase your amlodipine from 2.5 mg a day to 5 mg a day.  Continue your losartan and metoprolol as prescribed.  Keep a blood pressure log checking your blood pressure twice daily and take this to your PCP appointment on February 20 for further evaluation and treatment options for your blood pressure.  Try to decrease your salt intake which includes fried foods or eating out.    ED Prescriptions   None    PDMP not reviewed this encounter.   Radford Pax, NP 10/19/23 [phone]

## 2023-10-19 NOTE — ED Triage Notes (Signed)
Pt presents to UC for hypertension. Pt states BP reading today was 192/88. PCP appt on 2/20. Denies chest pain, sob, palpitations.

## 2023-10-25 ENCOUNTER — Encounter: Payer: Self-pay | Admitting: Obstetrics & Gynecology

## 2023-10-25 ENCOUNTER — Ambulatory Visit: Payer: 59 | Admitting: Obstetrics & Gynecology

## 2023-10-25 VITALS — BP 135/82 | HR 118 | Wt 247.0 lb

## 2023-10-25 DIAGNOSIS — N950 Postmenopausal bleeding: Secondary | ICD-10-CM

## 2023-10-25 DIAGNOSIS — N930 Postcoital and contact bleeding: Secondary | ICD-10-CM

## 2023-10-25 DIAGNOSIS — I10 Essential (primary) hypertension: Secondary | ICD-10-CM

## 2023-10-25 DIAGNOSIS — N8501 Benign endometrial hyperplasia: Secondary | ICD-10-CM | POA: Diagnosis not present

## 2023-10-25 NOTE — Progress Notes (Signed)
 Pt is in office for hysterectomy consult.  Endometrial Bx - 10/10/23 Last pap 09/13/21- normal

## 2023-10-25 NOTE — Progress Notes (Signed)
 Patient ID: Laura Fitzgerald, female   DOB: 05/13/1961, 63 y.o.   MRN: [id number]  Chief Complaint  Patient presents with   Consult    HPI Laura Fitzgerald is a 63 y.o. female.  [id number] Patient is a 63 y.o.divorced P3 here for [facility] for follow up of complex uterine hyperplasia without hyperplasia. She has had PMB for several years and was evaluated 06/2023 with ultrasound that showed 1.1 cm endometrial lining. An EMBX showed the above mentioned pathology. She comes for consult to schedule hysterectomy, having seen Drs. Dove and Para March previously  HPI  Past Medical History:  Diagnosis Date   Chronic kidney disease    Diabetes mellitus without complication (HCC)    Hyperlipemia    Hypertension     Past Surgical History:  Procedure Laterality Date   CHOLECYSTECTOMY  2021    No family history on file.  Social History Social History   Tobacco Use   Smoking status: Former    Types: Cigarettes   Smokeless tobacco: Never   Tobacco comments:    Pt states that she quit smoking about 40 years ago  Substance Use Topics   Alcohol use: No   Drug use: No    No Known Allergies  Current Outpatient Medications  Medication Sig Dispense Refill   amLODipine (NORVASC) 2.5 MG tablet Take 5 mg by mouth daily.     aspirin EC 81 MG tablet Take 81 mg by mouth daily.     cholecalciferol (VITAMIN D3) 25 MCG (1000 UNIT) tablet Take 1,000 Units by mouth daily.     diphenhydrAMINE (BENADRYL) 25 mg capsule Take 50 mg by mouth at bedtime.     FARXIGA 10 MG TABS tablet Take 10 mg by mouth daily.     glimepiride (AMARYL) 4 MG tablet Take 4 mg by mouth daily.  5   Haloperidol Lactate (HALDOL IJ) Inject as directed every 30 (thirty) days.     losartan (COZAAR) 25 MG tablet Take 25 mg by mouth daily.     megestrol (MEGACE) 40 MG tablet Take 1 tablet (40 mg total) by mouth 2 (two) times daily. Can increase to two tablets twice a day in the event of heavy bleeding 60 tablet 5   methocarbamol (ROBAXIN)  500 MG tablet Take 1 tablet (500 mg total) by mouth 2 (two) times daily. 10 tablet 0   metoprolol succinate (TOPROL-XL) 25 MG 24 hr tablet Take 25 mg by mouth daily.     pantoprazole (PROTONIX) 40 MG tablet Take 1 tablet (40 mg total) by mouth daily. 30 tablet 0   potassium chloride SA (KLOR-CON M) 20 MEQ tablet Take 1 tablet (20 mEq total) by mouth 2 (two) times daily. 6 tablet 0   simvastatin (ZOCOR) 20 MG tablet Take 20 mg by mouth daily.  5   No current facility-administered medications for this visit.    Review of Systems Review of Systems  Constitutional: Negative.   Respiratory: Negative.    Cardiovascular: Negative.   Genitourinary:  Negative for pelvic pain and vaginal bleeding.    Blood pressure 135/82, pulse (!) 118, weight 247 lb (112 kg).  Physical Exam Physical Exam Vitals and nursing note reviewed.  Constitutional:      Appearance: She is obese. She is not ill-appearing.  HENT:     Head: Normocephalic and atraumatic.  Cardiovascular:     Rate and Rhythm: Normal rate.  Pulmonary:     Effort: Pulmonary effort is normal.  Skin:    General:  Skin is warm and dry.     Data Reviewed Endometrial biopsy report  Assessment Patient is a candidate for [facility] for complex hyperplasia  Plan Sent request to scheduler for [facility] with me or Dr. Terrance Mass 10/25/2023, 10:28 AM

## 2023-11-24 ENCOUNTER — Telehealth: Payer: Self-pay

## 2023-11-24 NOTE — Telephone Encounter (Signed)
 Called patient to advise that I am unable to schedule her surgery at this time. Dr. Debroah Loop is not scheduled in the OR through May. Advised that I would review Dr. Lianne Bushy schedule to see if she has a new opening and provide her with a call back.

## 2024-01-04 ENCOUNTER — Telehealth: Payer: Self-pay | Admitting: Obstetrics & Gynecology

## 2024-01-04 NOTE — Telephone Encounter (Signed)
 Telephone call to patient to request she come by office to sign her hysterectomy statement.  Patient states she does not have transportation.  Instructed patient to notify us  when her pre-op is scheduled and we will facilitate this form being signed at that visit.    Informed patient that without this form her secondary insurance would not cover the procedure.

## 2024-01-09 ENCOUNTER — Other Ambulatory Visit: Payer: Self-pay | Admitting: Internal Medicine

## 2024-01-09 DIAGNOSIS — Z1231 Encounter for screening mammogram for malignant neoplasm of breast: Secondary | ICD-10-CM

## 2024-01-12 ENCOUNTER — Encounter (HOSPITAL_COMMUNITY): Payer: Self-pay | Admitting: Obstetrics & Gynecology

## 2024-01-12 NOTE — Pre-Procedure Instructions (Addendum)
 Surgical Instructions   Your procedure is scheduled on :  Thursday,  01-19-2024. Report to [facility] Main Entrance "A" at 8:00  A.M., then check in with the Admitting office. Any questions or running late day of surgery: call [phone]  Questions prior to your surgery date: call [phone], Monday-Friday, 8am-4pm. If you experience any cold or flu symptoms such as cough, fever, chills, shortness of breath, etc. between now and your scheduled surgery, please notify your surgeon office.    Remember:  Do not eat any food and do not drink any liquids after midnight the night before your surgery.  This includes no water,  candy,  and  mints.   Take these medicines the morning of surgery with A SIPS OF WATER : Metoprolol (toprol) Amlodipine (norvasc) Potassium chloride  (klor-con ) Finerenone Ethelene Herald)  May take these medicines IF NEEDED:  none   How to Manage Your Diabetes Before and After Surgery  Why is it important to control my blood sugar before and after surgery? Improving blood sugar levels before and after surgery helps healing and can limit problems. A way of improving blood sugar control is eating a healthy diet by:  Eating less sugar and carbohydrates  Increasing activity/exercise  Talking with your doctor about reaching your blood sugar goals High blood sugars (greater than 180 mg/dL) can raise your risk of infections and slow your recovery, so you will need to focus on controlling your diabetes during the weeks before surgery. Make sure that the doctor who takes care of your diabetes knows about your planned surgery including the date and location.  How do I manage my blood sugar before surgery? Check your blood sugar at least 4 times a day, starting 2 days before surgery, to make sure that the level is not too high or low. Check your blood sugar the morning of your surgery when you wake up. If your blood sugar is less than 70 mg/dL, you will need to treat for low  blood sugar: Treat a low blood sugar (less than 70 mg/dL) with  cup of clear juice (cranberry or apple) Recheck blood sugar in 15 minutes after treatment (to make sure it is greater than 70 mg/dL). If your blood sugar is not greater than 70 mg/dL on recheck, call [phone] for further instructions. Report your blood sugar to the short stay nurse when you get to Short Stay.  If you are admitted to the hospital after surgery: Your blood sugar will be checked by the staff and you will probably be given insulin after surgery (instead of oral diabetes medicines) to make sure you have good blood sugar levels. The goal for blood sugar control after surgery is 80-180 mg/dL.   WHAT DO I DO ABOUT MY DIABETES MEDICATION?  Do not take oral diabetes medicines (pills) the morning of surgery.        Do not take your Glipizide (amaryl) morning of surgery)     One week prior to surgery, STOP taking any Aspirin (unless otherwise instructed by your surgeon) Aleve, Naproxen, Ibuprofen, Motrin, Advil, Goody's, BC's, all herbal medications, fish oil, and non-prescription vitamins.                     Do NOT Smoke (Tobacco/Vaping) and Do Not drink alcohol for 24 hours prior to your procedure.  If you use a CPAP at night, you may bring your mask/headgear for your overnight stay.   You will be asked to remove any contacts, glasses, piercing's, hearing  aid's, dentures/partials prior to surgery. Please bring cases for these items if needed.    Patients discharged the day of surgery will not be allowed to drive home, and someone needs to stay with them for 24 hours.  SURGICAL WAITING ROOM VISITATION Patients may have no more than 2 support people in the waiting area - these visitors may rotate.   Pre-op nurse will coordinate an appropriate time for 1 ADULT support person, who may not rotate, to accompany patient in pre-op.  Children under the age of 22 must have an adult with them who is not the patient and  must remain in the main waiting area with an adult.  If the patient needs to stay at the hospital during part of their recovery, the visitor guidelines for inpatient rooms apply.  Please refer to the [facility] website for the visitor guidelines for any additional information.   If you received a COVID test during your pre-op visit  it is requested that you wear a mask when out in public, stay away from anyone that may not be feeling well and notify your surgeon if you develop symptoms. If you have been in contact with anyone that has tested positive in the last 10 days please notify you surgeon.      Pre-operative CHG Bathing Instructions   You can play a key role in reducing the risk of infection after surgery. Your skin needs to be as free of germs as possible. You can reduce the number of germs on your skin by washing with CHG (chlorhexidine gluconate) soap before surgery. CHG is an antiseptic soap that kills germs and continues to kill germs even after washing.   DO NOT use if you have an allergy to chlorhexidine/CHG or antibacterial soaps. If your skin becomes reddened or irritated, stop using the CHG and notify Pre-Op nurse day of surgery.  Please get dial soap or other antibacterial soap and shower following the instructions below.             TAKE A SHOWER THE NIGHT BEFORE SURGERY AND THE DAY OF SURGERY    Please keep in mind the following:  DO NOT shave, including legs and underarms, 48 hours prior to surgery.   You may shave your face before/day of surgery.  Place clean sheets on your bed the night before surgery Use a clean washcloth (not used since being washed) for each shower. DO NOT sleep with pet's night before surgery.  CHG Shower Instructions:  Wash your face and private area with normal soap. If you choose to wash your hair, wash first with your normal shampoo.  After you use shampoo/soap, rinse your hair and body thoroughly to remove shampoo/soap residue.  Turn  the water OFF and apply half the bottle of CHG soap to a CLEAN washcloth.  Apply CHG soap ONLY FROM YOUR NECK DOWN TO YOUR TOES (washing for 3-5 minutes)  DO NOT use CHG soap on face, private areas, open wounds, or sores.  Pay special attention to the area where your surgery is being performed.  If you are having back surgery, having someone wash your back for you may be helpful. Wait 2 minutes after CHG soap is applied, then you may rinse off the CHG soap.  Pat dry with a clean towel  Put on clean pajamas    Additional instructions for the day of surgery: DO NOT APPLY any lotions, powder's, oils, deodorants (may use underarm deodorant) , cologne/ perfumes or makeup Do not  wear jewelry / piercing's/ metal/  permanent jewelry must be removed prior to arrival day of surgery.  (No plastic piercing) Do not wear nail polish, gel polish, artificial nails, or any other type of covering on natural finger nails (toe nails are okay) Do not bring valuables to the hospital. [facility] is not responsible for valuables/personal belongings. Put on clean/comfortable clothes.  Please brush your teeth.  Ask your nurse before applying any prescription medications to the skin.

## 2024-01-12 NOTE — Progress Notes (Addendum)
 Addendum:  Called patient's nephrologist's office, Dr Yvonnie Heritage and requested last office note to be faxed.  Received via [employer] fax and is scanned in media in epic. (Made copy for chart)  Spoke w/ via phone for pre-op interview--- pt Lab needs dos----  no       Lab results------ lab appt 01-17-2024 @ 1000 getting CBC/ BMP/ T&S/ A1c (per anes) Current EKG in epic/ chart COVID test -----patient states asymptomatic no test needed Arrive at ------- 0800 on 01-19-2024 NPO after MN w/ exception sips of water w/ meds Pre-Surgery Ensure or G2: n/a  Med rec completed Medications to take morning of surgery ----- norvasc, toprol, zocor, klor-con  Diabetic medication -----  do not take amaryl morning of surgery .   Pt verbalized understanding stop farxiga 72 hours prior to surgery,  last dose to be Monday 01-16-2024 (per diabetes guidelines)  GLP1 agonist last dose:  n/a GLP1 instructions:  Patient instructed no nail polish to be worn day of surgery Patient instructed to bring photo id and insurance card day of surgery Patient aware to have Driver (ride ) / caregiver    for 24 hours after surgery - daughter, bronzley goodman Patient Special Instructions -----  will pick up bag w/ soap and written instructions at lab appt Pre-Op special Instructions -----  sent inbox message in epic to dr Willey Harrier, requested pre-op orders on 01-10-2024  Patient verbalized understanding of instructions that were given at this phone interview. Patient denies chest pain, sob, fever, cough at the interview.    Anesthesia Review:  HTN;   DM 2;   CKD 3;  simple type schizophrenia/ anxiety  PCP:  Dr Zachery Hermes. [place] Nephrologist:  Dr Yvonnie Heritage (lov per pt 6 months ago)  Chest x-ray : EKG :  02-14-2023 Echo : no Stress test: no Cardiac Cath :  no  Activity level:  denies sob w/ any activity Sleep Study/ CPAP :  no Fasting Blood Sugar :   130s   / Checks Blood Sugar -- times a day:  daily AM fasting  Blood Thinner/  Instructions /Last Dose:   no ASA / Instructions/ Last Dose :   ASA 81mg  Pt given instructions to stop week prior to surgery,  last dose today 01-12-2024

## 2024-01-13 ENCOUNTER — Encounter (HOSPITAL_COMMUNITY): Payer: Self-pay | Admitting: Obstetrics & Gynecology

## 2024-01-17 ENCOUNTER — Encounter (HOSPITAL_COMMUNITY)
Admission: RE | Admit: 2024-01-17 | Discharge: 2024-01-17 | Disposition: A | Discharge status: Home / Self Care | Source: Ambulatory Visit | Attending: Obstetrics & Gynecology | Admitting: Obstetrics & Gynecology

## 2024-01-17 ENCOUNTER — Telehealth: Payer: Self-pay

## 2024-01-17 ENCOUNTER — Other Ambulatory Visit: Payer: Self-pay | Admitting: Obstetrics & Gynecology

## 2024-01-17 DIAGNOSIS — Z01818 Encounter for other preprocedural examination: Secondary | ICD-10-CM | POA: Diagnosis present

## 2024-01-17 DIAGNOSIS — E119 Type 2 diabetes mellitus without complications: Secondary | ICD-10-CM | POA: Diagnosis not present

## 2024-01-17 DIAGNOSIS — N8501 Benign endometrial hyperplasia: Secondary | ICD-10-CM

## 2024-01-17 LAB — BASIC METABOLIC PANEL WITH GFR
Anion gap: 7 (ref 5–15)
BUN: 12 mg/dL (ref 8–23)
CO2: 23 mmol/L (ref 22–32)
Calcium: 8.9 mg/dL (ref 8.9–10.3)
Chloride: 107 mmol/L (ref 98–111)
Creatinine, Ser: 1.68 mg/dL — ABNORMAL HIGH (ref 0.44–1.00)
GFR, Estimated: 34 mL/min — ABNORMAL LOW (ref >60)
Glucose, Bld: 147 mg/dL — ABNORMAL HIGH (ref 70–99)
Potassium: 3.6 mmol/L (ref 3.5–5.1)
Sodium: 137 mmol/L (ref 135–145)

## 2024-01-17 LAB — CBC
HCT: 37.9 % (ref 36.0–46.0)
Hemoglobin: 11.9 g/dL — ABNORMAL LOW (ref 12.0–15.0)
MCH: 26.6 pg (ref 26.0–34.0)
MCHC: 31.4 g/dL (ref 30.0–36.0)
MCV: 84.8 fL (ref 80.0–100.0)
Platelets: 293 10*3/uL (ref 150–400)
RBC: 4.47 MIL/uL (ref 3.87–5.11)
RDW: 13.9 % (ref 11.5–15.5)
WBC: 10.4 10*3/uL (ref 4.0–10.5)
nRBC: 0 % (ref 0.0–0.2)

## 2024-01-17 LAB — HEMOGLOBIN A1C
Hgb A1c MFr Bld: 7.3 % — ABNORMAL HIGH (ref 4.8–5.6)
Mean Plasma Glucose: 163 mg/dL

## 2024-01-17 LAB — TYPE AND SCREEN
ABO/RH(D): O POS
Antibody Screen: NEGATIVE

## 2024-01-17 NOTE — Telephone Encounter (Signed)
 I called patient to ensure she signed her Medicaid consent form during her Pre-Op appointment on today?Patient confirmed the form was signed this morning.

## 2024-01-17 NOTE — Progress Notes (Signed)
 Orders for surgery

## 2024-01-18 ENCOUNTER — Other Ambulatory Visit (HOSPITAL_COMMUNITY)

## 2024-01-18 NOTE — Anesthesia Preprocedure Evaluation (Addendum)
 Anesthesia Evaluation  Patient identified by MRN, date of birth, ID band Patient awake    Reviewed: Allergy & Precautions, NPO status , Patient's Chart, lab work & pertinent test results, reviewed documented beta blocker date and time   History of Anesthesia Complications Negative for: history of anesthetic complications  Airway Mallampati: II  TM Distance: >3 FB Neck ROM: Full    Dental no notable dental hx. (+) Edentulous Upper, Missing,    Pulmonary former smoker   Pulmonary exam normal        Cardiovascular hypertension, Pt. on medications and Pt. on home beta blockers Normal cardiovascular exam     Neuro/Psych   Anxiety Depression  Schizophrenia     GI/Hepatic Neg liver ROS,GERD  Controlled,,  Endo/Other  diabetes, Type 2, Oral Hypoglycemic Agents    Renal/GU Renal InsufficiencyRenal disease (CrCl 45.68mL/min)     Musculoskeletal negative musculoskeletal ROS (+)    Abdominal   Peds  Hematology  (+) Blood dyscrasia (Hgb 11.9), anemia   Anesthesia Other Findings Day of surgery medications reviewed with patient.  Reproductive/Obstetrics  Complex endometrial hyperplasia without atypia                              Anesthesia Physical Anesthesia Plan  ASA: 2  Anesthesia Plan: General   Post-op Pain Management: Tylenol PO (pre-op)*   Induction: Intravenous  PONV Risk Score and Plan: 3 and Treatment may vary due to age or medical condition, Ondansetron , Dexamethasone, Midazolam, Propofol infusion and TIVA  Airway Management Planned: LMA  Additional Equipment: None  Intra-op Plan:   Post-operative Plan: Extubation in OR  Informed Consent: I have reviewed the patients History and Physical, chart, labs and discussed the procedure including the risks, benefits and alternatives for the proposed anesthesia with the patient or authorized representative who has indicated his/her  understanding and acceptance.     Dental advisory given  Plan Discussed with: CRNA  Anesthesia Plan Comments:         Anesthesia Quick Evaluation

## 2024-01-19 ENCOUNTER — Encounter (HOSPITAL_COMMUNITY): Payer: Self-pay | Admitting: Obstetrics & Gynecology

## 2024-01-19 ENCOUNTER — Other Ambulatory Visit: Payer: Self-pay

## 2024-01-19 ENCOUNTER — Ambulatory Visit (HOSPITAL_COMMUNITY): Admitting: Anesthesiology

## 2024-01-19 ENCOUNTER — Ambulatory Visit (HOSPITAL_BASED_OUTPATIENT_CLINIC_OR_DEPARTMENT_OTHER): Admitting: Anesthesiology

## 2024-01-19 ENCOUNTER — Observation Stay (HOSPITAL_COMMUNITY)
Admission: RE | Admit: 2024-01-19 | Discharge: 2024-01-20 | Disposition: A | Discharge status: Home / Self Care | Attending: Obstetrics & Gynecology | Admitting: Obstetrics & Gynecology

## 2024-01-19 ENCOUNTER — Encounter (HOSPITAL_COMMUNITY)
Admission: RE | Disposition: A | Discharge status: Home / Self Care | Payer: Self-pay | Source: Home / Self Care | Attending: Obstetrics & Gynecology

## 2024-01-19 DIAGNOSIS — D259 Leiomyoma of uterus, unspecified: Secondary | ICD-10-CM

## 2024-01-19 DIAGNOSIS — Z01818 Encounter for other preprocedural examination: Secondary | ICD-10-CM

## 2024-01-19 DIAGNOSIS — N8501 Benign endometrial hyperplasia: Secondary | ICD-10-CM | POA: Diagnosis not present

## 2024-01-19 DIAGNOSIS — S36409A Unspecified injury of unspecified part of small intestine, initial encounter: Secondary | ICD-10-CM | POA: Diagnosis not present

## 2024-01-19 DIAGNOSIS — N8500 Endometrial hyperplasia, unspecified: Secondary | ICD-10-CM | POA: Diagnosis not present

## 2024-01-19 DIAGNOSIS — N950 Postmenopausal bleeding: Secondary | ICD-10-CM

## 2024-01-19 DIAGNOSIS — E11630 Type 2 diabetes mellitus with periodontal disease: Secondary | ICD-10-CM | POA: Diagnosis not present

## 2024-01-19 DIAGNOSIS — N920 Excessive and frequent menstruation with regular cycle: Secondary | ICD-10-CM | POA: Diagnosis not present

## 2024-01-19 DIAGNOSIS — Z79899 Other long term (current) drug therapy: Secondary | ICD-10-CM | POA: Insufficient documentation

## 2024-01-19 DIAGNOSIS — N8003 Adenomyosis of the uterus: Secondary | ICD-10-CM

## 2024-01-19 DIAGNOSIS — N183 Chronic kidney disease, stage 3 unspecified: Secondary | ICD-10-CM | POA: Insufficient documentation

## 2024-01-19 DIAGNOSIS — Z7982 Long term (current) use of aspirin: Secondary | ICD-10-CM | POA: Diagnosis not present

## 2024-01-19 DIAGNOSIS — I129 Hypertensive chronic kidney disease with stage 1 through stage 4 chronic kidney disease, or unspecified chronic kidney disease: Secondary | ICD-10-CM | POA: Diagnosis not present

## 2024-01-19 DIAGNOSIS — Z87891 Personal history of nicotine dependence: Secondary | ICD-10-CM | POA: Diagnosis not present

## 2024-01-19 HISTORY — DX: Complete loss of teeth, unspecified cause, unspecified class: K08.109

## 2024-01-19 HISTORY — DX: Other schizophrenia: F20.89

## 2024-01-19 HISTORY — DX: Gastro-esophageal reflux disease without esophagitis: K21.9

## 2024-01-19 HISTORY — DX: Type 2 diabetes mellitus without complications: E11.9

## 2024-01-19 HISTORY — DX: Complete loss of teeth, unspecified cause, unspecified class: Z97.2

## 2024-01-19 HISTORY — DX: Hyperlipidemia, unspecified: E78.5

## 2024-01-19 HISTORY — DX: Benign endometrial hyperplasia: N85.01

## 2024-01-19 HISTORY — DX: Anxiety disorder, unspecified: F41.9

## 2024-01-19 HISTORY — DX: Secondary hyperparathyroidism of renal origin: N25.81

## 2024-01-19 HISTORY — DX: Postmenopausal bleeding: N95.0

## 2024-01-19 HISTORY — DX: Hypokalemia: E87.6

## 2024-01-19 HISTORY — PX: HYSTERECTOMY, VAGINAL, WITH SALPINGECTOMY: SHX7588

## 2024-01-19 HISTORY — PX: SMALL BOWEL REPAIR: SHX6447

## 2024-01-19 HISTORY — DX: Presence of spectacles and contact lenses: Z97.3

## 2024-01-19 HISTORY — DX: Chronic kidney disease, stage 3 unspecified: N18.30

## 2024-01-19 HISTORY — DX: Depression, unspecified: F32.A

## 2024-01-19 LAB — ABO/RH: ABO/RH(D): O POS

## 2024-01-19 LAB — GLUCOSE, CAPILLARY
Glucose-Capillary: 212 mg/dL — ABNORMAL HIGH (ref 70–99)
Glucose-Capillary: 215 mg/dL — ABNORMAL HIGH (ref 70–99)
Glucose-Capillary: 133 mg/dL — ABNORMAL HIGH (ref 70–99)
Glucose-Capillary: 221 mg/dL — ABNORMAL HIGH (ref 70–99)

## 2024-01-19 SURGERY — HYSTERECTOMY, VAGINAL, WITH SALPINGECTOMY
Anesthesia: General | Laterality: Bilateral

## 2024-01-19 MED ORDER — ONDANSETRON HCL 4 MG/2ML IJ SOLN: 4.0000 mg | Freq: Four times a day (QID) | INTRAMUSCULAR | Status: DC | PRN

## 2024-01-19 MED ORDER — PROPOFOL 500 MG/50ML IV EMUL
INTRAVENOUS | Status: DC | PRN
  Administered 2024-01-19: 1 ug/kg/min via INTRAVENOUS
  Administered 2024-01-19: 100 ug/kg/min via INTRAVENOUS

## 2024-01-19 MED ORDER — GABAPENTIN 100 MG PO CAPS
100.0000 mg | ORAL_CAPSULE | Freq: Three times a day (TID) | ORAL | Status: DC
Start: 2024-01-19 — End: 2024-01-20
  Administered 2024-01-19 – 2024-01-20 (×3): 100 mg via ORAL
  Filled 2024-01-19 (×3): qty 1

## 2024-01-19 MED ORDER — HYDROMORPHONE HCL 1 MG/ML IJ SOLN: 1.0000 mg | INTRAMUSCULAR | Status: DC | PRN

## 2024-01-19 MED ORDER — LIDOCAINE 2% (20 MG/ML) 5 ML SYRINGE
INTRAMUSCULAR | Status: DC | PRN
  Administered 2024-01-19: 100 mg via INTRAVENOUS

## 2024-01-19 MED ORDER — LACTATED RINGERS IV SOLN: INTRAVENOUS | Status: AC

## 2024-01-19 MED ORDER — ONDANSETRON HCL 4 MG PO TABS: 4.0000 mg | ORAL_TABLET | Freq: Four times a day (QID) | ORAL | Status: DC | PRN

## 2024-01-19 MED ORDER — ACETAMINOPHEN 500 MG PO TABS
1000.0000 mg | ORAL_TABLET | Freq: Once | ORAL | Status: AC
Start: 2024-01-19 — End: 2024-01-19
  Administered 2024-01-19: 1000 mg via ORAL

## 2024-01-19 MED ORDER — INSULIN ASPART 100 UNIT/ML IJ SOLN
0.0000 [IU] | Freq: Three times a day (TID) | INTRAMUSCULAR | Status: DC
  Administered 2024-01-19: 5 [IU] via SUBCUTANEOUS
  Administered 2024-01-20: 2 [IU] via SUBCUTANEOUS

## 2024-01-19 MED ORDER — OXYCODONE HCL 5 MG PO TABS
5.0000 mg | ORAL_TABLET | ORAL | Status: DC | PRN
  Administered 2024-01-19 (×2): 5 mg via ORAL
  Filled 2024-01-19 (×2): qty 1

## 2024-01-19 MED ORDER — OXYCODONE HCL 5 MG PO TABS: 5.0000 mg | ORAL_TABLET | Freq: Once | ORAL | Status: DC | PRN

## 2024-01-19 MED ORDER — OXYCODONE HCL 5 MG/5ML PO SOLN: 5.0000 mg | Freq: Once | ORAL | Status: DC | PRN

## 2024-01-19 MED ORDER — KETOROLAC TROMETHAMINE 30 MG/ML IJ SOLN
30.0000 mg | Freq: Four times a day (QID) | INTRAMUSCULAR | Status: AC
  Administered 2024-01-19 – 2024-01-20 (×4): 30 mg via INTRAVENOUS
  Filled 2024-01-19 (×4): qty 1

## 2024-01-19 MED ORDER — INSULIN ASPART 100 UNIT/ML IJ SOLN
INTRAMUSCULAR | Status: AC
  Administered 2024-01-19: 4 [IU]
  Filled 2024-01-19: qty 1

## 2024-01-19 MED ORDER — POVIDONE-IODINE 10 % EX SWAB
2.0000 | Freq: Once | CUTANEOUS | Status: AC
  Administered 2024-01-19: 2 via TOPICAL

## 2024-01-19 MED ORDER — FENTANYL CITRATE (PF) 100 MCG/2ML IJ SOLN
25.0000 ug | INTRAMUSCULAR | Status: DC | PRN
  Administered 2024-01-19 (×3): 25 ug via INTRAVENOUS

## 2024-01-19 MED ORDER — PHENYLEPHRINE 80 MCG/ML (10ML) SYRINGE FOR IV PUSH (FOR BLOOD PRESSURE SUPPORT)
PREFILLED_SYRINGE | INTRAVENOUS | Status: AC
Start: 2024-01-19 — End: ?
  Filled 2024-01-19: qty 10

## 2024-01-19 MED ORDER — FINERENONE 10 MG PO TABS: 1.0000 | ORAL_TABLET | Freq: Every day | ORAL | Status: DC

## 2024-01-19 MED ORDER — ACETAMINOPHEN 500 MG PO TABS
ORAL_TABLET | ORAL | Status: AC
  Filled 2024-01-19: qty 2

## 2024-01-19 MED ORDER — DAPAGLIFLOZIN PROPANEDIOL 10 MG PO TABS
10.0000 mg | ORAL_TABLET | Freq: Every day | ORAL | Status: DC
  Administered 2024-01-20: 10 mg via ORAL
  Filled 2024-01-19: qty 1

## 2024-01-19 MED ORDER — LACTATED RINGERS IV SOLN: INTRAVENOUS | Status: DC

## 2024-01-19 MED ORDER — LOSARTAN POTASSIUM 25 MG PO TABS
25.0000 mg | ORAL_TABLET | Freq: Every day | ORAL | Status: DC
  Administered 2024-01-20: 25 mg via ORAL
  Filled 2024-01-19: qty 1

## 2024-01-19 MED ORDER — PROPOFOL 10 MG/ML IV BOLUS
INTRAVENOUS | Status: AC
  Filled 2024-01-19: qty 20

## 2024-01-19 MED ORDER — INSULIN ASPART 100 UNIT/ML IJ SOLN: 0.0000 [IU] | INTRAMUSCULAR | Status: DC | PRN

## 2024-01-19 MED ORDER — ZOLPIDEM TARTRATE 5 MG PO TABS
5.0000 mg | ORAL_TABLET | Freq: Every evening | ORAL | Status: DC | PRN
  Administered 2024-01-19: 5 mg via ORAL
  Filled 2024-01-19: qty 1

## 2024-01-19 MED ORDER — LIDOCAINE-EPINEPHRINE (PF) 1 %-1:200000 IJ SOLN
INTRAMUSCULAR | Status: DC | PRN
  Administered 2024-01-19: 20 mL

## 2024-01-19 MED ORDER — DIPHENHYDRAMINE HCL 25 MG PO CAPS: 50.0000 mg | ORAL_CAPSULE | Freq: Every evening | ORAL | Status: DC | PRN

## 2024-01-19 MED ORDER — FENTANYL CITRATE (PF) 250 MCG/5ML IJ SOLN
INTRAMUSCULAR | Status: DC | PRN
Start: 2024-01-19 — End: 2024-01-19
  Administered 2024-01-19 (×3): 50 ug via INTRAVENOUS

## 2024-01-19 MED ORDER — GLIMEPIRIDE 4 MG PO TABS
4.0000 mg | ORAL_TABLET | Freq: Every day | ORAL | Status: DC
  Administered 2024-01-20: 4 mg via ORAL
  Filled 2024-01-19: qty 1

## 2024-01-19 MED ORDER — FENTANYL CITRATE (PF) 250 MCG/5ML IJ SOLN
INTRAMUSCULAR | Status: AC
  Filled 2024-01-19: qty 5

## 2024-01-19 MED ORDER — CHLORHEXIDINE GLUCONATE 0.12 % MT SOLN
OROMUCOSAL | Status: AC
Start: 2024-01-19 — End: 2024-01-19
  Filled 2024-01-19: qty 15

## 2024-01-19 MED ORDER — POTASSIUM CHLORIDE CRYS ER 20 MEQ PO TBCR
20.0000 meq | EXTENDED_RELEASE_TABLET | Freq: Two times a day (BID) | ORAL | Status: DC
  Administered 2024-01-20: 20 meq via ORAL
  Filled 2024-01-19 (×2): qty 1

## 2024-01-19 MED ORDER — 0.9 % SODIUM CHLORIDE (POUR BTL) OPTIME
TOPICAL | Status: DC | PRN
Start: 2024-01-19 — End: 2024-01-19
  Administered 2024-01-19: 1000 mL

## 2024-01-19 MED ORDER — METOPROLOL SUCCINATE ER 25 MG PO TB24
25.0000 mg | ORAL_TABLET | Freq: Every day | ORAL | Status: DC
  Administered 2024-01-20: 25 mg via ORAL
  Filled 2024-01-19: qty 1

## 2024-01-19 MED ORDER — IBUPROFEN 600 MG PO TABS: 600.0000 mg | ORAL_TABLET | Freq: Four times a day (QID) | ORAL | Status: DC

## 2024-01-19 MED ORDER — MIDAZOLAM HCL 2 MG/2ML IJ SOLN
INTRAMUSCULAR | Status: DC | PRN
Start: 2024-01-19 — End: 2024-01-19
  Administered 2024-01-19: 2 mg via INTRAVENOUS

## 2024-01-19 MED ORDER — ORAL CARE MOUTH RINSE: 15.0000 mL | Freq: Once | OROMUCOSAL | Status: AC

## 2024-01-19 MED ORDER — ESTRADIOL 0.1 MG/GM VA CREA
TOPICAL_CREAM | VAGINAL | Status: AC
Start: 2024-01-19 — End: ?
  Filled 2024-01-19: qty 42.5

## 2024-01-19 MED ORDER — STERILE WATER FOR IRRIGATION IR SOLN
Status: DC | PRN
  Administered 2024-01-19: 1000 mL

## 2024-01-19 MED ORDER — ONDANSETRON HCL 4 MG/2ML IJ SOLN
INTRAMUSCULAR | Status: AC
Start: 2024-01-19 — End: ?
  Filled 2024-01-19: qty 2

## 2024-01-19 MED ORDER — PHENYLEPHRINE 80 MCG/ML (10ML) SYRINGE FOR IV PUSH (FOR BLOOD PRESSURE SUPPORT)
PREFILLED_SYRINGE | INTRAVENOUS | Status: DC | PRN
  Administered 2024-01-19: 80 ug via INTRAVENOUS

## 2024-01-19 MED ORDER — LIDOCAINE-EPINEPHRINE (PF) 1 %-1:200000 IJ SOLN
INTRAMUSCULAR | Status: AC
  Filled 2024-01-19: qty 30

## 2024-01-19 MED ORDER — CHLORHEXIDINE GLUCONATE 0.12 % MT SOLN
15.0000 mL | Freq: Once | OROMUCOSAL | Status: AC
  Administered 2024-01-19: 15 mL via OROMUCOSAL

## 2024-01-19 MED ORDER — FENTANYL CITRATE (PF) 100 MCG/2ML IJ SOLN
INTRAMUSCULAR | Status: AC
  Filled 2024-01-19: qty 2

## 2024-01-19 MED ORDER — TRANEXAMIC ACID-NACL 1000-0.7 MG/100ML-% IV SOLN
1000.0000 mg | Freq: Once | INTRAVENOUS | Status: AC
  Administered 2024-01-19: 1000 mg via INTRAVENOUS

## 2024-01-19 MED ORDER — CEFAZOLIN SODIUM-DEXTROSE 2-4 GM/100ML-% IV SOLN
2.0000 g | INTRAVENOUS | Status: AC
  Administered 2024-01-19: 2 g via INTRAVENOUS

## 2024-01-19 MED ORDER — LIDOCAINE 2% (20 MG/ML) 5 ML SYRINGE
INTRAMUSCULAR | Status: AC
  Filled 2024-01-19: qty 5

## 2024-01-19 MED ORDER — MIDAZOLAM HCL 2 MG/2ML IJ SOLN
INTRAMUSCULAR | Status: AC
  Filled 2024-01-19: qty 2

## 2024-01-19 MED ORDER — PROPOFOL 10 MG/ML IV BOLUS
INTRAVENOUS | Status: DC | PRN
  Administered 2024-01-19: 200 mg via INTRAVENOUS

## 2024-01-19 MED ORDER — TRANEXAMIC ACID-NACL 1000-0.7 MG/100ML-% IV SOLN
INTRAVENOUS | Status: AC
  Filled 2024-01-19: qty 100

## 2024-01-19 MED ORDER — CEFAZOLIN SODIUM-DEXTROSE 2-4 GM/100ML-% IV SOLN
INTRAVENOUS | Status: AC
  Filled 2024-01-19: qty 100

## 2024-01-19 MED ORDER — SODIUM CHLORIDE 0.9 % IV SOLN
INTRAVENOUS | Status: DC
  Administered 2024-01-19: 1000 mL via INTRAVENOUS

## 2024-01-19 MED ORDER — AMLODIPINE BESYLATE 5 MG PO TABS
5.0000 mg | ORAL_TABLET | Freq: Every day | ORAL | Status: DC
  Administered 2024-01-20: 5 mg via ORAL
  Filled 2024-01-19: qty 1

## 2024-01-19 MED ORDER — ONDANSETRON HCL 4 MG/2ML IJ SOLN
INTRAMUSCULAR | Status: DC | PRN
  Administered 2024-01-19: 4 mg via INTRAVENOUS

## 2024-01-19 MED ORDER — INSULIN ASPART 100 UNIT/ML IJ SOLN
INTRAMUSCULAR | Status: AC
  Filled 2024-01-19: qty 1

## 2024-01-19 MED ORDER — ASPIRIN 81 MG PO TBEC
81.0000 mg | DELAYED_RELEASE_TABLET | Freq: Every day | ORAL | Status: DC
  Administered 2024-01-20: 81 mg via ORAL
  Filled 2024-01-19: qty 1

## 2024-01-19 MED ORDER — DROPERIDOL 2.5 MG/ML IJ SOLN: 0.6250 mg | Freq: Once | INTRAMUSCULAR | Status: DC | PRN

## 2024-01-19 SURGICAL SUPPLY — 26 items
BLADE SURG 10 STRL SS (BLADE) IMPLANT
GAUZE 4X4 16PLY ~~LOC~~+RFID DBL (SPONGE) ×4 IMPLANT
GLOVE BIO SURGEON STRL SZ 6.5 (GLOVE) ×2 IMPLANT
GLOVE BIOGEL PI IND STRL 7.0 (GLOVE) ×4 IMPLANT
GLOVE ECLIPSE 6.0 STRL STRAW (GLOVE) ×2 IMPLANT
GOWN STRL REUS W/TWL LRG LVL3 (GOWN DISPOSABLE) ×8 IMPLANT
HIBICLENS CHG 4% 4OZ BTL (MISCELLANEOUS) ×2 IMPLANT
HOLDER FOLEY CATH W/STRAP (MISCELLANEOUS) IMPLANT
KIT TURNOVER KIT B (KITS) ×2 IMPLANT
LIGASURE IMPACT 36 18CM CVD LR (INSTRUMENTS) IMPLANT
NS IRRIG 1000ML POUR BTL (IV SOLUTION) ×2 IMPLANT
PACK VAGINAL MINOR WOMEN LF (CUSTOM PROCEDURE TRAY) IMPLANT
PAD OB MATERNITY 11 LF (PERSONAL CARE ITEMS) ×2 IMPLANT
PENCIL SMOKE EVACUATOR (MISCELLANEOUS) IMPLANT
SLEEVE SCD COMPRESS KNEE MED (STOCKING) ×2 IMPLANT
SUT SILK 3 0SH CR/8 30 (SUTURE) IMPLANT
SUT VIC AB 0 CT1 18XCR BRD8 (SUTURE) ×4 IMPLANT
SUT VIC AB 0 CT1 36 (SUTURE) ×2 IMPLANT
SUT VIC AB 2-0 CT1 TAPERPNT 27 (SUTURE) ×4 IMPLANT
SUT VICRYL 0 TIES 12 18 (SUTURE) ×2 IMPLANT
SYR 10ML LL (SYRINGE) IMPLANT
TOWEL GREEN STERILE FF (TOWEL DISPOSABLE) ×2 IMPLANT
TRAY FOLEY W/BAG SLVR 14FR LF (SET/KITS/TRAYS/PACK) ×2 IMPLANT
TUBE CONNECTING 12X1/4 (SUCTIONS) IMPLANT
UNDERPAD 30X36 HEAVY ABSORB (UNDERPADS AND DIAPERS) ×2 IMPLANT
YANKAUER SUCT BULB TIP NO VENT (SUCTIONS) IMPLANT

## 2024-01-19 NOTE — Anesthesia Procedure Notes (Signed)
 Procedure Name: LMA Insertion Date/Time: 01/19/2024 9:52 AM  Performed by: India Jolin C, CRNAPre-anesthesia Checklist: Patient identified, Emergency Drugs available, Suction available and Patient being monitored Patient Re-evaluated:Patient Re-evaluated prior to induction Oxygen Delivery Method: Circle System Utilized Preoxygenation: Pre-oxygenation with 100% oxygen Induction Type: IV induction Ventilation: Mask ventilation without difficulty LMA: LMA inserted LMA Size: 4.0 Number of attempts: 1 Airway Equipment and Method: Bite block Placement Confirmation: positive ETCO2 Tube secured with: Tape Dental Injury: Teeth and Oropharynx as per pre-operative assessment

## 2024-01-19 NOTE — Care Management Obs Status (Signed)
 MEDICARE OBSERVATION STATUS NOTIFICATION   Patient Details  Name: Laura Fitzgerald MRN: [id number] Date of Birth: 02-16-1961   Medicare Observation Status Notification Given:       [name] 01/19/2024, 4:20 PM

## 2024-01-19 NOTE — Transfer of Care (Signed)
 Immediate Anesthesia Transfer of Care Note  Patient: Laura Fitzgerald  Procedure(s) Performed: HYSTERECTOMY, VAGINAL, WITH SALPINGECTOMY (Bilateral) REPAIR, SMALL INTESTINE  Patient Location: PACU  Anesthesia Type:General  Level of Consciousness: awake and sedated  Airway & Oxygen Therapy: Patient Spontanous Breathing and Patient connected to face mask oxygen  Post-op Assessment: Report given to RN and Post -op Vital signs reviewed and stable  Post vital signs: Reviewed and stable  Last Vitals:  Vitals Value Taken Time  BP 106/66 01/19/24 1134  Temp 36.4 C 01/19/24 1134  Pulse 77 01/19/24 1143  Resp 17 01/19/24 1143  SpO2 100 % 01/19/24 1143  Vitals shown include unfiled device data.  Last Pain:  Vitals:   01/19/24 1134  TempSrc:   PainSc: Asleep      Patients Stated Pain Goal: 6 (01/19/24 0834)  Complications: No notable events documented.

## 2024-01-19 NOTE — Anesthesia Postprocedure Evaluation (Signed)
 Anesthesia Post Note  Patient: Laura Fitzgerald  Procedure(s) Performed: HYSTERECTOMY, VAGINAL, WITH SALPINGECTOMY (Bilateral) REPAIR, SMALL INTESTINE     Patient location during evaluation: PACU Anesthesia Type: General Level of consciousness: awake and alert Pain management: pain level controlled Vital Signs Assessment: post-procedure vital signs reviewed and stable Respiratory status: spontaneous breathing, nonlabored ventilation and respiratory function stable Cardiovascular status: blood pressure returned to baseline Postop Assessment: no apparent nausea or vomiting Anesthetic complications: no   No notable events documented.  Last Vitals:  Vitals:   01/19/24 1215 01/19/24 1230  BP: 117/75 126/74  Pulse: 69 68  Resp: 14 13  Temp:    SpO2: 100% 100%                  Rayfield Cairo

## 2024-01-19 NOTE — Op Note (Signed)
 Laura Fitzgerald PROCEDURE DATE: 01/19/2024  PREOPERATIVE DIAGNOSIS:  Symptomatic fibroids, menorrhagia POSTOPERATIVE DIAGNOSIS:  Symptomatic fibroids, menorrhagia, small bowel injury repaired SURGEON:  Laura Fruit, MD Consult: Laura Harry MD ASSISTANT:  RNFA  An experienced assistant was required given the standard of surgical care given the complexity of the case.  This assistant was needed for exposure, dissection, suctioning, retraction, instrument exchange, and for overall help during the procedure. OPERATION:  Total Vaginal Hysterectomy, Bilateral Salpingectomy, repair of small bowel injury ANESTHESIA:  General endotracheal.  INDICATIONS: The patient is a 62 y.o. [id number] with history of postmenopausal bleeding and complex endometrial hyperplasia. The patient made a decision to undergo definite surgical treatment. On the preoperative visit, the risks, benefits, indications, and alternatives of the procedure were reviewed with the patient.  On the day of surgery, the risks of surgery were again discussed with the patient including but not limited to: bleeding which may require transfusion or reoperation; infection which may require antibiotics; injury to bowel, bladder, ureters or other surrounding organs; need for additional procedures; thromboembolic phenomenon, incisional problems and other postoperative/anesthesia complications. Written informed consent was obtained.    OPERATIVE FINDINGS: A 6 week size uterus with normal ovary and tube on the right, left not identified. Small cautery injury to small bowel and repaired by general surgeon  ESTIMATED BLOOD LOSS: 50 ml FLUIDS:  600 ml of Lactated Ringers URINE OUTPUT:  50 ml of clear yellow urine. SPECIMENS:  Uterus and cervix and right fallopian tube sent to pathology COMPLICATIONS:  Bowel injury  DESCRIPTION OF PROCEDURE:  The patient received intravenous antibiotics and had sequential compression devices applied to her lower  extremities while in the preoperative area.  She was then taken to the operating room where general anesthesia was administered and was found to be adequate.  She was placed in the dorsal lithotomy position, and was prepped and draped in a sterile manner.  A Foley catheter was inserted into her bladder and attached to constant drainage. After an adequate timeout was performed, attention was turned to her pelvis.  A weighted speculum was then placed in the vagina, and the anterior and posterior lips of the cervix were grasped bilaterally with tenaculums.  The cervix was then injected circumferentially with 1%% Lidocaine with epinephrine solution to maintain hemostasis.  The cervix was then circumferentially incised, and the bladder was dissected off the pubocervical fascia anteriorly without complication.  The anterior cul-de-sac was then entered sharply without difficulty and a retractor was placed.  The same procedure was performed posteriorly and the posterior cul-de-sac was entered sharply without difficulty.   The anterior and posterior cuffs were reefed to their respective adjacent peritoneal layers.   A long weighted speculum was inserted into the posterior cul-de-sac.  The Heaney clamp was then used to clamp the uterosacral ligaments on either side.  They were then cut and sutured ligated with 0 Vicryl, and the ligated uterosacral ligaments were transfixed to the ipsilateral vaginal epithelium to further support the vagina and provide hemostasis.   Of note, all sutures used in this case were 0 Vicryl unless otherwise noted.     The cardinal ligaments were then clamped, cauterized and cut with the Ligasure Impact Device. The uterine vessels and broad ligaments were then serially clamped, cauterized and cut on both sides with the Ligasure. The uterus has to be cored to help with exposure and to be able to get to the uteroovarian ligaments.  The uterus was then delivered via the posterior cul-de-sac,  and the  cornua were clamped, cauterized and cut with the Ligasure.  The fallopian tube  on the right including the fimbria was excised with LIgasure.   Small bowel cautery injury less than 1 cm identified and Dr. Delane Fitzgerald consulted. See his note. The area was oversewn with serosa to imbricate the site.   All pedicles from the uterosacral ligament to the cornua were examined hemostasis was confirmed.   The vaginal cuff was then closed with a in a running locked fashion with care given to incorporate the uterosacral pedicles bilaterally.  All instruments were then removed from the pelvis  and a vaginal packing was placed.  The patient tolerated the procedure well.  All instruments, needles, and sponge counts were correct times three.  The patient was extubated successfully, and was taken to the recovery room in stable condition.      Laura Fruit, MD Obstetrician & Gynecologist, [facility] for [facility], [facility]

## 2024-01-19 NOTE — Op Note (Signed)
 Preoperative diagnosis: small bowel serosal injury Postoperative diagnosis: saa Procedure: repair small bowel serosal tear Matt Murdis Flitton Specimens none  Indications: 63 yof undergoing vag hyst with ligasure injury to small bowel I was asked to evaluated  Procedure: discussed case with Dr Willey Harrier who showed me injury. This was serosal (not full thickness) injury to bowel with energy.  There was a small < 5 mm serosal tear with small amount surrounding tissue that was involved.  I elected to oversew this with 3-0 silk sutures to imbricate healthy tissue. This was done with four sutures. The bowel otherwise was healthy and still appears to be plenty patent. This was all done via vagina.   I then turned case back over to Dr Willey Harrier.

## 2024-01-19 NOTE — H&P (Signed)
 Laura Fitzgerald is an 63 y.o. female. [id number] No LMP recorded. Patient is postmenopausal. Patient had postmenopausal bleeding and endometrial biopsy showed complex hyperplasia without atypia. She is scheduled for [facility] and bilateral salpingectomy.  Pertinent Gynecological History: Previous GYN Procedures: endometrial biopsy   Last pap: normal Date: 09/2021 OB History: G3, P3   Menstrual History:  No LMP recorded. Patient is postmenopausal.    Past Medical History:  Diagnosis Date   Anxiety    Chronic kidney disease (CKD), stage III (moderate) (HCC)    neurphrologist--- dr foster   Complex endometrial hyperplasia without atypia    Depression    Full dentures    GERD (gastroesophageal reflux disease)    Hyperlipidemia    Hypertension    Hypokalemia    PMB (postmenopausal bleeding)    Secondary hyperparathyroidism of renal origin (HCC)    Simple type schizophrenia (HCC)    Type 2 diabetes mellitus (HCC)    followed by pcp;   (01-12-2024  pt stated check blood sugar daily in am fasting,  average 130s)   Wears glasses     Past Surgical History:  Procedure Laterality Date   CHOLECYSTECTOMY, LAPAROSCOPIC  10/20/2017   @ [facility] by dr gross   TUBAL LIGATION Bilateral 1994   PPTL    History reviewed. No pertinent family history.  Social History:  reports that she has quit smoking. Her smoking use included cigarettes. She has never used smokeless tobacco. She reports that she does not drink alcohol and does not use drugs.  Allergies:  Allergies  Allergen Reactions   Lisinopril Cough    Medications Prior to Admission  Medication Sig Dispense Refill Last Dose/Taking   amLODipine (NORVASC) 5 MG tablet Take 5 mg by mouth daily.   01/19/2024 Morning   aspirin EC 81 MG tablet Take 81 mg by mouth daily.   01/12/2024   cholecalciferol (VITAMIN D3) 25 MCG (1000 UNIT) tablet Take 1,000 Units by mouth daily.   01/18/2024   dicyclomine (BENTYL) 10 MG capsule Take 10 mg by mouth 3 (three)  times daily before meals.   Past Month   diphenhydrAMINE HCl, Sleep, 50 MG CAPS Take 2 capsules by mouth at bedtime.   01/18/2024   FARXIGA 10 MG TABS tablet Take 10 mg by mouth daily.   01/18/2024 at 12:00 PM   Finerenone (KERENDIA) 10 MG TABS Take 1 tablet by mouth daily.   01/19/2024 Morning   glimepiride (AMARYL) 4 MG tablet Take 4 mg by mouth daily.  5 01/18/2024   Haloperidol Lactate (HALDOL IJ) Inject as directed every 30 (thirty) days.   Taking   losartan (COZAAR) 25 MG tablet Take 25 mg by mouth daily.   01/18/2024   megestrol  (MEGACE ) 40 MG tablet Take 1 tablet (40 mg total) by mouth 2 (two) times daily. Can increase to two tablets twice a day in the event of heavy bleeding (Patient taking differently: Take 40 mg by mouth daily. Can increase to two tablets twice a day in the event of heavy bleeding) 60 tablet 5 01/18/2024   metoprolol succinate (TOPROL-XL) 25 MG 24 hr tablet Take 25 mg by mouth daily.   01/19/2024 at  6:30 AM   potassium chloride  SA (KLOR-CON  M) 20 MEQ tablet Take 1 tablet (20 mEq total) by mouth 2 (two) times daily. 6 tablet 0 01/12/2024   simvastatin (ZOCOR) 20 MG tablet Take 20 mg by mouth daily.  5 01/18/2024    Review of Systems  Constitutional: Negative.  Respiratory: Negative.    Cardiovascular: Negative.   Gastrointestinal: Negative.   Genitourinary:  Negative for vaginal bleeding and vaginal discharge.    Blood pressure 118/86, pulse (!) 116, temperature 97.7 F (36.5 C), temperature source Oral, resp. rate 19, height 5' 9.5" (1.765 m), weight 113.4 kg, SpO2 98%. Physical Exam Vitals and nursing note reviewed. Exam conducted with a chaperone present.  Constitutional:      Appearance: Normal appearance. She is obese.  HENT:     Head: Normocephalic and atraumatic.  Cardiovascular:     Rate and Rhythm: Normal rate.  Pulmonary:     Effort: Pulmonary effort is normal.  Musculoskeletal:        General: Normal range of motion.  Neurological:     General: No  focal deficit present.     Mental Status: She is alert.  Psychiatric:        Mood and Affect: Mood normal.        Behavior: Behavior normal.     Results for orders placed or performed during the hospital encounter of 01/19/24 (from the past 24 hours)  Glucose, capillary     Status: Abnormal   Collection Time: 01/19/24  8:57 AM  Result Value Ref Range   Glucose-Capillary 212 (H) 70 - 99 mg/dL  Glucose, capillary     Status: Abnormal   Collection Time: 01/19/24  9:00 AM  Result Value Ref Range   Glucose-Capillary 215 (H) 70 - 99 mg/dL  CBC    Component Value Date/Time   WBC 10.4 01/17/2024 1000   RBC 4.47 01/17/2024 1000   HGB 11.9 (L) 01/17/2024 1000   HCT 37.9 01/17/2024 1000   PLT 293 01/17/2024 1000   MCV 84.8 01/17/2024 1000   MCH 26.6 01/17/2024 1000   MCHC 31.4 01/17/2024 1000   RDW 13.9 01/17/2024 1000   LYMPHSABS 2.0 10/17/2023 1938   MONOABS 0.6 10/17/2023 1938   EOSABS 0.2 10/17/2023 1938   BASOSABS 0.0 10/17/2023 1938     No results found.  Assessment/Plan: Endometrial complex hyperplasia for TVH  BS.  The risks of surgery were discussed in detail with the patient including but not limited to: bleeding which may require transfusion or reoperation; infection which may require prolonged hospitalization or re-hospitalization and antibiotic therapy; injury to bowel, bladder, ureters and major vessels or other surrounding organs which may lead to other procedures; formation of adhesions; need for additional procedures including laparotomy or subsequent procedures secondary to intraoperative injury or abnormal pathology; thromboembolic phenomenon; incisional problems and other postoperative or anesthesia complications.  Patient was told that the likelihood that her condition and symptoms will be treated effectively with this surgical management was very high; the postoperative expectations were also discussed in detail. The patient also understands the alternative treatment  options which were discussed in full. All questions were answered.    Onnie Bilis 01/19/2024, 9:24 AM

## 2024-01-20 ENCOUNTER — Encounter (HOSPITAL_COMMUNITY): Payer: Self-pay | Admitting: Obstetrics & Gynecology

## 2024-01-20 DIAGNOSIS — D259 Leiomyoma of uterus, unspecified: Secondary | ICD-10-CM | POA: Diagnosis not present

## 2024-01-20 LAB — SURGICAL PATHOLOGY

## 2024-01-20 LAB — GLUCOSE, CAPILLARY: Glucose-Capillary: 129 mg/dL — ABNORMAL HIGH (ref 70–99)

## 2024-01-20 MED ORDER — OXYCODONE HCL 5 MG PO TABS: 5.0000 mg | ORAL_TABLET | ORAL | 0 refills | Status: AC | PRN

## 2024-01-20 MED ORDER — IBUPROFEN 600 MG PO TABS: 600.0000 mg | ORAL_TABLET | Freq: Four times a day (QID) | ORAL | 0 refills | Status: AC

## 2024-01-20 NOTE — Plan of Care (Signed)
  Problem: Education: Goal: Ability to describe self-care measures that may prevent or decrease complications (Diabetes Survival Skills Education) will improve Outcome: Adequate for Discharge Goal: Individualized Educational Video(s) Outcome: Adequate for Discharge   Problem: Coping: Goal: Ability to adjust to condition or change in health will improve Outcome: Adequate for Discharge   Problem: Fluid Volume: Goal: Ability to maintain a balanced intake and output will improve Outcome: Adequate for Discharge   Problem: Health Behavior/Discharge Planning: Goal: Ability to identify and utilize available resources and services will improve Outcome: Adequate for Discharge Goal: Ability to manage health-related needs will improve Outcome: Adequate for Discharge   Problem: Metabolic: Goal: Ability to maintain appropriate glucose levels will improve Outcome: Adequate for Discharge   Problem: Nutritional: Goal: Maintenance of adequate nutrition will improve Outcome: Adequate for Discharge Goal: Progress toward achieving an optimal weight will improve Outcome: Adequate for Discharge   Problem: Skin Integrity: Goal: Risk for impaired skin integrity will decrease Outcome: Adequate for Discharge   Problem: Tissue Perfusion: Goal: Adequacy of tissue perfusion will improve Outcome: Adequate for Discharge   Problem: Education: Goal: Knowledge of General Education information will improve Description: Including pain rating scale, medication(s)/side effects and non-pharmacologic comfort measures Outcome: Adequate for Discharge   Problem: Health Behavior/Discharge Planning: Goal: Ability to manage health-related needs will improve Outcome: Adequate for Discharge   Problem: Clinical Measurements: Goal: Ability to maintain clinical measurements within normal limits will improve Outcome: Adequate for Discharge Goal: Will remain free from infection Outcome: Adequate for Discharge Goal:  Diagnostic test results will improve Outcome: Adequate for Discharge Goal: Respiratory complications will improve Outcome: Adequate for Discharge Goal: Cardiovascular complication will be avoided Outcome: Adequate for Discharge   Problem: Activity: Goal: Risk for activity intolerance will decrease Outcome: Adequate for Discharge   Problem: Nutrition: Goal: Adequate nutrition will be maintained Outcome: Adequate for Discharge   Problem: Coping: Goal: Level of anxiety will decrease Outcome: Adequate for Discharge   Problem: Elimination: Goal: Will not experience complications related to bowel motility Outcome: Adequate for Discharge Goal: Will not experience complications related to urinary retention Outcome: Adequate for Discharge   Problem: Pain Managment: Goal: General experience of comfort will improve and/or be controlled Outcome: Adequate for Discharge   Problem: Safety: Goal: Ability to remain free from injury will improve Outcome: Adequate for Discharge   Problem: Skin Integrity: Goal: Risk for impaired skin integrity will decrease Outcome: Adequate for Discharge   Problem: Education: Goal: Knowledge of the prescribed therapeutic regimen will improve Outcome: Adequate for Discharge Goal: Understanding of sexual limitations or changes related to disease process or condition will improve Outcome: Adequate for Discharge Goal: Individualized Educational Video(s) Outcome: Adequate for Discharge   Problem: Self-Concept: Goal: Communication of feelings regarding changes in body function or appearance will improve Outcome: Adequate for Discharge   Problem: Skin Integrity: Goal: Demonstration of wound healing without infection will improve Outcome: Adequate for Discharge

## 2024-01-20 NOTE — Progress Notes (Signed)
 1 Day Post-Op   Subjective/Chief Complaint: Having flatus, tol diet   Objective: Vital signs in last 24 hours: Temp:  [97.5 F (36.4 C)-98.2 F (36.8 C)] 98.2 F (36.8 C) (05/16 0403) Pulse Rate:  [68-116] 84 (05/16 0403) Resp:  [13-22] 16 (05/16 0403) BP: (106-146)/(60-86) 136/61 (05/16 0403) SpO2:  [98 %-100 %] 100 % (05/15 1314) Weight:  [113.4 kg] 113.4 kg (05/15 0834)    Intake/Output from previous day: 05/15 0701 - 05/16 0700 In: 1244.3 [I.V.:1044.3; IV Piggyback:200] Out: 925 [Urine:875; Blood:50] Intake/Output this shift: Total I/O In: -  Out: 675 [Urine:675]  Ab soft, nt/nd  Lab Results:  Recent Labs    01/17/24 1000  WBC 10.4  HGB 11.9*  HCT 37.9  PLT 293   BMET Recent Labs    01/17/24 1000  NA 137  K 3.6  CL 107  CO2 23  GLUCOSE 147*  BUN 12  CREATININE 1.68*  CALCIUM 8.9   PT/INR No results for input(s): "LABPROT", "INR" in the last 72 hours. ABG No results for input(s): "PHART", "HCO3" in the last 72 hours.  Invalid input(s): "PCO2", "PO2"  Studies/Results: No results found.  Anti-infectives: Anti-infectives (From admission, onward)    Start     Dose/Rate Route Frequency Ordered Stop   01/19/24 0817  ceFAZolin (ANCEF) 2-4 GM/100ML-% IVPB       Note to Pharmacy: Andris Keels L: cabinet override      01/19/24 0817 01/19/24 0957   01/19/24 0815  ceFAZolin (ANCEF) IVPB 2g/100 mL premix        2 g 200 mL/hr over 30 Minutes Intravenous On call to O.R. 01/19/24 0814 01/19/24 0954       Assessment/Plan: POD 1 small bowel repair -can dc home today  Enid Harry 01/20/2024

## 2024-01-20 NOTE — Discharge Summary (Signed)
 Gynecology Physician Postoperative Discharge Summary  Patient ID: Laura Fitzgerald MRN: [id number] DOB/AGE: 64-Jan-1962 64 y.o.  Admit Date: 01/19/2024 Discharge Date: 01/20/2024  Preoperative Diagnoses: Pre-op testing - Plan: CANCELED: CBG per Guidelines for Diabetes Management for Patients Undergoing Surgery ([facility], AP, and [facility] only), CANCELED: CBG per protocol, CANCELED: Hemoglobin A1c per protocol, CANCELED: Basic metabolic panel per protocol, CANCELED: CBC per protocol, CANCELED: Type and screen, CANCELED: CBG per Guidelines for Diabetes Management for Patients Undergoing Surgery ([facility], AP, and [facility] only), CANCELED: CBG per protocol  Complex endometrial hyperplasia without atypia - Plan: CANCELED: Type and screen, CANCELED: Type and screen  Endometrial hyperplasia without atypia - Plan: Discharge patient   Procedures: Procedure(s) (LRB): HYSTERECTOMY, VAGINAL, WITH SALPINGECTOMY (Bilateral) REPAIR, SMALL INTESTINE  Hospital Course:  Laura Fitzgerald is a 63 y.o. [id number]  admitted for scheduled surgery.  She underwent the procedures as mentioned above, her operation was uncomplicated. For further details about surgery, please refer to the operative report. Small cautery burn on small intestine was repaired inter- operatively by Dr. Janey Meek. Patient had an uncomplicated postoperative course. By time of discharge on POD#1, her pain was controlled on oral pain medications; she was ambulating, voiding without difficulty, tolerating regular diet and passing flatus. She was deemed stable for discharge to home.   Significant Labs:    Latest Ref Rng & Units 01/17/2024   10:00 AM 10/17/2023    7:38 PM 01/20/2023   12:00 AM  CBC  WBC 4.0 - 10.5 K/uL 10.4  11.9  8.9   Hemoglobin 12.0 - 15.0 g/dL 14.7  82.9  56.2   Hematocrit 36.0 - 46.0 % 37.9  38.2  39.6   Platelets 150 - 400 K/uL 293  291  253     Discharge Exam: Blood pressure 135/76, pulse 77, temperature 98.1 F (36.7 C), temperature  source Oral, resp. rate 17, height 5' 9.5" (1.765 m), weight 113.4 kg, SpO2 97%. General appearance: alert and no distress  Resp: clear to auscultation bilaterally  Cardio: regular rate and rhythm  GI: soft, non-tender; bowel sounds normal; no masses, no organomegaly.  Pelvic: scant blood on pad  Extremities: extremities normal, atraumatic, no cyanosis or edema and Homans sign is negative, no sign of DVT  Discharged Condition: Stable  Disposition: Discharge disposition: 01-Home or Self Care       Discharge Instructions     Discharge patient   Complete by: As directed    Discharge disposition: 01-Home or Self Care   Discharge patient date: 01/20/2024      Allergies as of 01/20/2024       Reactions   Lisinopril Cough        Medication List     STOP taking these medications    aspirin EC 81 MG tablet   cholecalciferol 25 MCG (1000 UNIT) tablet Commonly known as: VITAMIN D3   dicyclomine 10 MG capsule Commonly known as: BENTYL   megestrol  40 MG tablet Commonly known as: MEGACE    potassium chloride  SA 20 MEQ tablet Commonly known as: KLOR-CON  M       TAKE these medications    amLODipine 5 MG tablet Commonly known as: NORVASC Take 5 mg by mouth daily.   diphenhydrAMINE HCl (Sleep) 50 MG Caps Take 2 capsules by mouth at bedtime.   Farxiga 10 MG Tabs tablet Generic drug: dapagliflozin propanediol Take 10 mg by mouth daily.   glimepiride 4 MG tablet Commonly known as: AMARYL Take 4 mg by mouth daily.   HALDOL  IJ Inject as directed every 30 (thirty) days.   ibuprofen 600 MG tablet Commonly known as: ADVIL Take 1 tablet (600 mg total) by mouth every 6 (six) hours.   Kerendia 10 MG Tabs Generic drug: Finerenone Take 1 tablet by mouth daily.   losartan 25 MG tablet Commonly known as: COZAAR Take 25 mg by mouth daily.   metoprolol succinate 25 MG 24 hr tablet Commonly known as: TOPROL-XL Take 25 mg by mouth daily.   oxyCODONE 5 MG immediate  release tablet Commonly known as: Oxy IR/ROXICODONE Take 1-2 tablets (5-10 mg total) by mouth every 4 (four) hours as needed for moderate pain (pain score 4-6).   simvastatin 20 MG tablet Commonly known as: ZOCOR Take 20 mg by mouth daily.        Follow-up Information     [facility] for [facility] Follow up in 4 week(s).   Specialty: Obstetrics and Gynecology Why: Willey Harrier postop Contact information: [address], [address] [place]  [address] [phone]                Signed: Tresia Fruit, MD, FACOG Obstetrician & Gynecologist Faculty Practice, [facility]

## 2024-01-20 NOTE — Progress Notes (Signed)
   01/20/24 1545  Departure Condition  Departure Condition Good  Mobility at [employer]  Patient/Caregiver Teaching Discharge instructions reviewed;Prescriptions reviewed;Pain management discussed;Educated about hypertension in pregnancy;Patient/caregiver verbalized understanding;Follow-up care reviewed;Teach Back Method Used;Medications discussed  Departure Mode With family  Was procedural sedation performed on this patient during this visit? Yes   Patient alert and oriented x4, VS and pain stable at discharge.

## 2024-01-23 ENCOUNTER — Ambulatory Visit: Payer: Self-pay | Admitting: Obstetrics & Gynecology

## 2024-01-23 ENCOUNTER — Telehealth: Payer: Self-pay

## 2024-01-23 NOTE — Telephone Encounter (Signed)
 Pt called stating after hysterectomy on 5/15 she was told to stop taking Aspirin  and Vitamin D3. Pt states she has taken these "all of her life" and would like to resume if/when she can. Secure chat sent to Dr. Dodie Frees who is in office today about when/if pt can resume both of these. Informed pt I would call her back as soon as I have an answer for her.

## 2024-01-25 NOTE — Telephone Encounter (Signed)
 Call returned to patient. RN advised that we're unable to refill her sleeping medication. Pt has upcoming appt with PCP. RN advised for patient to consult with PCP about medication for sleep. Pt appreciative.

## 2024-02-14 ENCOUNTER — Ambulatory Visit
Admission: RE | Admit: 2024-02-14 | Discharge: 2024-02-14 | Disposition: A | Discharge status: Home / Self Care | Source: Ambulatory Visit | Attending: Internal Medicine | Admitting: Internal Medicine

## 2024-02-14 DIAGNOSIS — Z1231 Encounter for screening mammogram for malignant neoplasm of breast: Secondary | ICD-10-CM

## 2024-02-16 ENCOUNTER — Ambulatory Visit: Admitting: Physician Assistant

## 2024-03-07 ENCOUNTER — Ambulatory Visit: Admitting: Obstetrics & Gynecology

## 2024-03-07 ENCOUNTER — Encounter: Payer: Self-pay | Admitting: Obstetrics & Gynecology

## 2024-03-07 VITALS — BP 123/81 | HR 79 | Ht 69.0 in | Wt 252.0 lb

## 2024-03-07 DIAGNOSIS — N950 Postmenopausal bleeding: Secondary | ICD-10-CM

## 2024-03-07 DIAGNOSIS — N8501 Benign endometrial hyperplasia: Secondary | ICD-10-CM

## 2024-03-07 DIAGNOSIS — Z09 Encounter for follow-up examination after completed treatment for conditions other than malignant neoplasm: Secondary | ICD-10-CM

## 2024-03-07 NOTE — Progress Notes (Signed)
 63 y.o. GYN presents for Post Op TVH and Bilateral Salpingectomy.

## 2024-03-07 NOTE — Progress Notes (Signed)
 Patient ID: Laura Fitzgerald, female   DOB: 1961-05-13, 63 y.o.   MRN: [id number] Subjective:doing well      Laura Fitzgerald is a 63 y.o. female who presents to the clinic 5 weeks status post vaginal hysterectomy and BS for abnormal uterine bleeding and complex hyperplasia. Eating a regular diet without difficulty. Bowel movements are normal. The patient is not having any pain.  The following portions of the patient's history were reviewed and updated as appropriate: allergies, current medications, past family history, past medical history, past social history, past surgical history, and problem list.  Review of Systems Pertinent items are noted in HPI.    Objective:    BP 123/81   Pulse 79   Ht 5' 9 (1.753 m)   Wt 252 lb (114.3 kg)   BMI 37.21 kg/m  General:  alert, cooperative, and no distress  Abdomen: soft, bowel sounds active, non-tender  Incision:   N/a     Assessment:    Doing well postoperatively. Operative findings again reviewed. Pathology report discussed.    Plan:    1. Continue any current medications. 2. Wound care discussed. 3. Activity restrictions: none 4. Anticipated return to work: not applicable. 5. Follow up: routine yearly mammography  Eveline Lynwood MATSU, MD 03/07/2024
